# Patient Record
Sex: Male | Born: 1940 | Race: White | Hispanic: No | Marital: Single | Smoking: Former smoker
Health system: Southern US, Community
[De-identification: ages and names within clinical notes are randomized; demographics above are authoritative.]

## PROBLEM LIST (undated history)

## (undated) DIAGNOSIS — I10 Essential (primary) hypertension: Secondary | ICD-10-CM

## (undated) DIAGNOSIS — E7800 Pure hypercholesterolemia, unspecified: Secondary | ICD-10-CM

## (undated) DIAGNOSIS — G629 Polyneuropathy, unspecified: Secondary | ICD-10-CM

## (undated) DIAGNOSIS — Z85038 Personal history of other malignant neoplasm of large intestine: Secondary | ICD-10-CM

## (undated) DIAGNOSIS — C3411 Malignant neoplasm of upper lobe, right bronchus or lung: Secondary | ICD-10-CM

## (undated) HISTORY — DX: Pure hypercholesterolemia, unspecified: E78.00

## (undated) HISTORY — DX: Malignant neoplasm of upper lobe, right bronchus or lung: C34.11

## (undated) HISTORY — DX: Polyneuropathy, unspecified: G62.9

## (undated) HISTORY — DX: Personal history of other malignant neoplasm of large intestine: Z85.038

---

## 2007-02-09 HISTORY — PX: COLECTOMY: SHX59

## 2007-09-09 DIAGNOSIS — Z85038 Personal history of other malignant neoplasm of large intestine: Secondary | ICD-10-CM

## 2007-09-09 HISTORY — DX: Personal history of other malignant neoplasm of large intestine: Z85.038

## 2014-06-28 ENCOUNTER — Ambulatory Visit (HOSPITAL_COMMUNITY)
Admission: RE | Admit: 2014-06-28 | Discharge: 2014-06-28 | Disposition: A | Discharge status: Home / Self Care | Payer: Medicare Other | Source: Ambulatory Visit | Attending: Neurology | Admitting: Neurology

## 2014-06-28 ENCOUNTER — Other Ambulatory Visit: Payer: Self-pay | Admitting: Neurology

## 2014-06-28 DIAGNOSIS — J439 Emphysema, unspecified: Secondary | ICD-10-CM | POA: Diagnosis not present

## 2014-06-28 DIAGNOSIS — R200 Anesthesia of skin: Secondary | ICD-10-CM

## 2014-06-28 DIAGNOSIS — R911 Solitary pulmonary nodule: Secondary | ICD-10-CM | POA: Diagnosis not present

## 2014-07-10 ENCOUNTER — Other Ambulatory Visit: Payer: Self-pay | Admitting: Neurology

## 2014-07-10 ENCOUNTER — Ambulatory Visit (HOSPITAL_COMMUNITY)
Admission: RE | Admit: 2014-07-10 | Discharge: 2014-07-10 | Disposition: A | Discharge status: Home / Self Care | Payer: Medicare Other | Source: Ambulatory Visit | Attending: Neurology | Admitting: Neurology

## 2014-07-10 ENCOUNTER — Encounter (HOSPITAL_COMMUNITY): Payer: Self-pay

## 2014-07-10 DIAGNOSIS — R918 Other nonspecific abnormal finding of lung field: Secondary | ICD-10-CM | POA: Diagnosis present

## 2014-07-10 HISTORY — DX: Essential (primary) hypertension: I10

## 2014-07-10 MED ORDER — IOHEXOL 300 MG/ML  SOLN
100.0000 mL | Freq: Once | INTRAMUSCULAR | Status: AC | PRN
  Administered 2014-07-10: 80 mL via INTRAVENOUS

## 2014-07-18 ENCOUNTER — Encounter (HOSPITAL_COMMUNITY): Payer: Medicare Other | Attending: Oncology | Admitting: Oncology

## 2014-07-18 ENCOUNTER — Encounter (HOSPITAL_COMMUNITY): Payer: Self-pay | Admitting: Oncology

## 2014-07-18 VITALS — BP 163/81 | HR 104 | Temp 97.9°F | Resp 18 | Ht 70.25 in | Wt 171.0 lb

## 2014-07-18 DIAGNOSIS — C3411 Malignant neoplasm of upper lobe, right bronchus or lung: Secondary | ICD-10-CM | POA: Diagnosis not present

## 2014-07-18 DIAGNOSIS — Z87891 Personal history of nicotine dependence: Secondary | ICD-10-CM | POA: Insufficient documentation

## 2014-07-18 HISTORY — DX: Malignant neoplasm of upper lobe, right bronchus or lung: C34.11

## 2014-07-18 NOTE — Progress Notes (Addendum)
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    DIAGNOSIS:  Same as above.  HISTORY OF PRESENT ILLNESS:   Mr. Antenucci is a 74 year old white American man with a past medical history significant for HTN and hyperlipidemia who is referred to [facility] for a newly discover right pancoast tumor measuring 6.7 cm in largest dimension infiltrating surrounding soft tissue and bone with pathologic fractures of T2 and T3 vertebrae and posterior ribs during work-up for upper extremity via work-up by Dr. Merlene Laughter for right upper extremity neuralgia.  Chart reviewed.  Other than imaging performed at [facility], Dr. Freddie Apley notes are used for data collection.  I personally reviewed and went over radiographic studies with the patient.  The results are noted within this dictation.    He reports that beginning in Jan 2016 he started to notice a right arm/hand neuralgia.  He started researching local neurologists in [place], [place] area, but found that it would take him a long time to get an appointment with one locally.  He expanded his search and found Dr. Merlene Laughter who was able to see the patient sooner than other neurologists.  Therefore, he saw Dr. Merlene Laughter who started evaluation for right upper extremity neuralgia.  Work-up included a chest xray that was worrisome for malignancy finding in right upper lung.  This was subsequently followed by CT of chest performed last week.  Referral was sent to oncology and we received the referral form yesterday, 6/8.  He was worked in today for new patient evaluation.  The  patient reports that his appetite is strong, "too good."  He notes that his weight is stable.  He denies any chest pain, hemoptysis, heart palpitations, SOB, dyspnea.  He denies any abdominal pain, blood in stool, black tarry stool, urinary complaints, double vision, headaches.  He is able to button his buttons on his shirt and pants.  He is wearing a button down shirt today.  He denies dropping items in the grasp of his right hand.  He notes that he is using his left hand more often.  He notes an episode of right hand tremors after extensive use of right hand.     PAST MEDICAL HISTORY:   Past Medical History  Diagnosis Date  . Hypertension   . Hypercholesteremia   . History of colon cancer 09/2007  . Peripheral neuropathy     bilateral arms, right worse than left    ALLERGIES: No Known Allergies    MEDICATIONS: I have reviewed the patient's current medications.    No current outpatient prescriptions on file prior to visit.   No current facility-administered medications on file prior to visit.     PAST SURGICAL HISTORY Past Surgical History  Procedure Laterality Date  . Colectomy  2009    FAMILY HISTORY: Family History  Problem Relation Age of Onset  . Cancer Mother   . Cancer Maternal Grandmother   . Diabetes Paternal Grandfather     SOCIAL HISTORY: He admits to a 60 pack year smoking history; smoking 1 ppd since he was 74 years  old, quitting about 6 weeks ago.  "I just decided to quit."  "I have quite a thousand times before."  He currently is chewing nicorette gum.    He admits to social EtOH.  He reports that on Friday- Sunday he has 1-2 beers.  Since his right arm numbness he has noted that having 1-2 nightcaps at HS helps him sleep better so he has been doing that as well.    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  He then worked for the [employer], [employer] x 17 years and he retired in the late 1990's.    He did graduate with a [place] in  business from [employer].   PERFORMANCE STATUS: The patient's performance status is 1 - Symptomatic but completely ambulatory  PHYSICAL EXAM: Most Recent Vital Signs: Blood pressure 163/81, pulse 104, temperature 97.9 F (36.6 C), temperature source Oral, resp. rate 18, height 5' 10.25" (1.784 m), weight 171 lb (77.565 kg), SpO2 99 %. General appearance: alert, cooperative, appears stated age, no distress and well educated Head: Normocephalic, without obvious abnormality, atraumatic Eyes: conjunctivae/corneas clear. PERRL, EOM's intact. Fundi benign. Ears: left external auditory canal cerumen impaction noted.  Right ear is clear with minimal cerumen and pearly white TM Throat: lips, mucosa, and tongue normal; teeth and gums normal Neck: no adenopathy and supple, symmetrical, trachea midline Lungs: clear to auscultation bilaterally and normal percussion bilaterally Heart: regular rate and rhythm, S1, S2 normal, no murmur, click, rub or gallop Abdomen: soft, non-tender; bowel sounds normal; no masses,  no organomegaly Extremities: extremities normal, atraumatic, no cyanosis or edema Skin: Skin color, texture, turgor normal. No rashes or lesions Lymph nodes: Cervical, supraclavicular, and axillary nodes normal. Neurologic: Alert and oriented X 3, normal strength and tone. Normal symmetric reflexes. Normal coordination and gait  LABORATORY DATA:  No results found for this or any previous visit (from the past 48 hour(s)).    RADIOGRAPHY:  07/10/2014  CLINICAL DATA: 74 year old male with asymmetric right apical pulmonary opacity on plain radiographs done for upper extremity numbness. Subsequent encounter.  EXAM: CT CHEST WITH CONTRAST  TECHNIQUE: Multidetector CT imaging of the chest was performed during intravenous contrast administration.  CONTRAST: 97m OMNIPAQUE IOHEXOL 300 MG/ML SOLN  COMPARISON: Chest radiographs 06/28/2014.  FINDINGS: Poorly marginated and  spiculated soft tissue mass occupying the right lung apex with destruction of the right right second and third ribs as well as the second and third thoracic vertebrae. Obliteration of the right T2 neural foramen with tumor extension into the spinal canal, right lateral epidural space on series 2, image 9. Mild pathologic fractures of both the T2 and T3 vertebrae. Cephalad extension of tumor above the right lung apex best seen on coronal image 48. All told, tumor encompasses 54 x 60 x 67 mm (AP by transverse by CC)  Negative thyroid. Small but conspicuous lymph node in the right tracheoesophageal groove just anterior to the tumor on series 2, image 11. No axillary lymphadenopathy. Small but conspicuous prevascular lymph node just anterior and to the right of the trachea on series 2, image 16. Other mediastinal and hilar nodes appear within normal limits.  Medial right upper lobe tumor involvement. Major airways remain patent. Mild curvilinear scarring or atelectasis in both lower lobes. No pleural effusion. No pulmonary nodule/metastasis identified.  No pericardial effusion. Major mediastinal vascular structures appear within normal limits; there is calcified coronary artery atherosclerosis. The right apical tumor is in proximity to the bifurcation of the  right brachiocephalic artery into the right CCA and subclavian (series 2, image 8).  There is heterogeneous enlargement of the right adrenal gland with spiculated margins, encompassing 50 x 27 x 37 mm (AP by transverse by CC). The left adrenal gland is within normal limits. No superior abdominal lymphadenopathy identified. No visible liver metastasis identified.  Cholelithiasis. Visible spleen, pancreas, kidneys, and bowel in the upper abdomen are within normal limits.  Outside of the T2 and T3 level bone involvement, no distant osseous metastasis is identified.  IMPRESSION: 1. Pancoast tumor on the right encompassing  54 x 60 x 67 mm. Infiltration of the surrounding soft tissues and bone with pathologic fractures of the right T2 and T3 vertebrae and posterior ribs. Obliteration of the right T2 neural foramen and epidural tumor placing the patient at risk for future spinal cord compression. 2. Right adrenal metastasis. No other distant metastasis identified.  Electronically Signed: By: Genevie Ann M.D. On: 07/11/2014 08:38    PATHOLOGY:  None  ASSESSMENT:  1. 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  2. Right adrenal metastasis 3. Tobacco abuse, quitting about 6 weeks ago.  60+ pack year smoking history 4. EtOH use, socially 5. HTN 6. Hyperlipidemia   PLAN:  1. I personally reviewed and went over radiographic studies with the patient.  The results are noted within this dictation.   2. Chart reviewed 3. Labs next week: CBC diff, CMET 4. PET scan ASAP 5. IR biopsy of pancoast tumor for treatment planning. 6. Referral to [facility] in [place], [place].  We will call and contact Rad Oncologist there.  I spoke with Dr. Meda Coffee and he has agreed to see the patient in the very near future.  The patient is not a surgical candidate given his epidural tumor.  We would request radiation to lung tumor and possibly to adrenal metastasis. 7. Return in 1.5 weeks for follow-up.  All questions were answered. The patient knows to call the clinic with any problems, questions or concerns. We can certainly see the patient much sooner if necessary.  Patient and plan discussed with Dr. Ancil Linsey and she is in agreement with the aforementioned.   Robynn Pane, PA-C 07/18/2014 9:35 AM   Patient seen and examined. As above.  His disease is extensive in the chest and given invasion seen on imaging into thoracic vertebrae not a surgical candidate.  He has an excellent PS and would do well with  concurrent therapy which would offer reasonable disease control.  I would recommend XRT given his disease and symptomatology.  Would defer to XRT whether or not to radiate the solitary adrenal lesion.  Patient wishes to be treated in [place], he is single and has few social resources.  Will arrange for XRT in [place].    He agrees to PET and biopsy after discussion today.  Will arrange and regroup afterwards. Molli Hazard, MD

## 2014-07-18 NOTE — Patient Instructions (Signed)
[place] at [facility] Discharge Instructions  RECOMMENDATIONS MADE BY THE CONSULTANT AND ANY TEST RESULTS WILL BE SENT TO YOUR REFERRING PHYSICIAN.  Exam and discussion by Robynn Pane, PA-C and Dr. Whitney Muse Will make referral to radiation therapy in [place] Refer to [facility] for biopsy PET Scan - to be done at [facility].  Nothing to eat or drink 6 hours prior to the scan.  No sugar of any kind  Follow-up her in 1 - 2 weeks.  Thank you for choosing [place] at [facility] to provide your oncology and hematology care.  To afford each patient quality time with our provider, please arrive at least 15 minutes before your scheduled appointment time.    You need to re-schedule your appointment should you arrive 10 or more minutes late.  We strive to give you quality time with our providers, and arriving late affects you and other patients whose appointments are after yours.  Also, if you no show three or more times for appointments you may be dismissed from the clinic at the providers discretion.     Again, thank you for choosing [facility].  Our hope is that these requests will decrease the amount of time that you wait before being seen by our physicians.       _____________________________________________________________  Should you have questions after your visit to [facility], please contact our office at [phone] between the hours of 8:30 a.m. and 4:30 p.m.  Voicemails left after 4:30 p.m. will not be returned until the following business day.  For prescription refill requests, have your pharmacy contact our office.

## 2014-07-23 ENCOUNTER — Other Ambulatory Visit: Payer: Self-pay | Admitting: Radiology

## 2014-07-24 ENCOUNTER — Ambulatory Visit (HOSPITAL_COMMUNITY)
Admission: RE | Admit: 2014-07-24 | Discharge: 2014-07-24 | Disposition: A | Discharge status: Home / Self Care | Payer: Medicare Other | Source: Ambulatory Visit | Attending: Oncology | Admitting: Oncology

## 2014-07-24 DIAGNOSIS — C3411 Malignant neoplasm of upper lobe, right bronchus or lung: Secondary | ICD-10-CM | POA: Diagnosis not present

## 2014-07-24 DIAGNOSIS — Z87891 Personal history of nicotine dependence: Secondary | ICD-10-CM | POA: Insufficient documentation

## 2014-07-24 LAB — CBC
HCT: 39.1 % (ref 39.0–52.0)
HEMOGLOBIN: 13.7 g/dL (ref 13.0–17.0)
MCH: 33.3 pg (ref 26.0–34.0)
MCHC: 35 g/dL (ref 30.0–36.0)
MCV: 95.1 fL (ref 78.0–100.0)
Platelets: 324 10*3/uL (ref 150–400)
RBC: 4.11 MIL/uL — AB (ref 4.22–5.81)
RDW: 13.4 % (ref 11.5–15.5)
WBC: 9.6 10*3/uL (ref 4.0–10.5)

## 2014-07-24 LAB — PROTIME-INR
INR: 1.07 (ref 0.00–1.49)
PROTHROMBIN TIME: 14.1 s (ref 11.6–15.2)

## 2014-07-24 LAB — APTT: aPTT: 33 seconds (ref 24–37)

## 2014-07-24 MED ORDER — SODIUM CHLORIDE 0.9 % IV SOLN: Freq: Once | INTRAVENOUS | Status: DC

## 2014-07-24 NOTE — Progress Notes (Signed)
Pt stated that at 830 this AM he had 2 cups of coffee and and sausage biscuit.  He said no one informed him that he was not to eat.  Jannifer Franklin PA has been notified .  Will await further instructions

## 2014-07-24 NOTE — Progress Notes (Signed)
Patient ID: Justin Becker, male   DOB: [birth year], 74 y.o.   MRN: [id number]   Pt was scheduled for Rt pancoast tumor bx Pt ate breakfast at 8am today  Offered to perform procedure later this afternoon Pt chose to reschedule to different day secondary ride arrangements.  Scheduler aware to reschedule and call pt with earliest appt

## 2014-07-25 ENCOUNTER — Encounter (HOSPITAL_COMMUNITY)
Admission: RE | Admit: 2014-07-25 | Discharge: 2014-07-25 | Disposition: A | Discharge status: Home / Self Care | Payer: Medicare Other | Source: Ambulatory Visit | Attending: Oncology | Admitting: Oncology

## 2014-07-25 DIAGNOSIS — C3411 Malignant neoplasm of upper lobe, right bronchus or lung: Secondary | ICD-10-CM

## 2014-07-25 LAB — GLUCOSE, CAPILLARY: Glucose-Capillary: 90 mg/dL (ref 65–99)

## 2014-07-25 MED ORDER — FLUDEOXYGLUCOSE F - 18 (FDG) INJECTION
8.5200 | Freq: Once | INTRAVENOUS | Status: AC | PRN
  Administered 2014-07-25: 8.52 via INTRAVENOUS

## 2014-07-26 ENCOUNTER — Other Ambulatory Visit: Payer: Self-pay | Admitting: Radiology

## 2014-07-29 ENCOUNTER — Ambulatory Visit (HOSPITAL_COMMUNITY)
Admission: RE | Admit: 2014-07-29 | Discharge: 2014-07-29 | Disposition: A | Discharge status: Home / Self Care | Payer: Medicare Other | Source: Ambulatory Visit | Attending: Oncology | Admitting: Oncology

## 2014-07-29 ENCOUNTER — Ambulatory Visit (HOSPITAL_COMMUNITY)
Admission: RE | Admit: 2014-07-29 | Discharge: 2014-07-29 | Disposition: A | Discharge status: Home / Self Care | Payer: Medicare Other | Source: Ambulatory Visit | Attending: Interventional Radiology | Admitting: Interventional Radiology

## 2014-07-29 ENCOUNTER — Encounter (HOSPITAL_COMMUNITY): Payer: Self-pay

## 2014-07-29 DIAGNOSIS — G629 Polyneuropathy, unspecified: Secondary | ICD-10-CM | POA: Insufficient documentation

## 2014-07-29 DIAGNOSIS — Z85828 Personal history of other malignant neoplasm of skin: Secondary | ICD-10-CM | POA: Insufficient documentation

## 2014-07-29 DIAGNOSIS — E780 Pure hypercholesterolemia: Secondary | ICD-10-CM | POA: Insufficient documentation

## 2014-07-29 DIAGNOSIS — Z87891 Personal history of nicotine dependence: Secondary | ICD-10-CM | POA: Diagnosis not present

## 2014-07-29 DIAGNOSIS — I10 Essential (primary) hypertension: Secondary | ICD-10-CM | POA: Diagnosis not present

## 2014-07-29 DIAGNOSIS — Z85038 Personal history of other malignant neoplasm of large intestine: Secondary | ICD-10-CM | POA: Diagnosis not present

## 2014-07-29 DIAGNOSIS — C3411 Malignant neoplasm of upper lobe, right bronchus or lung: Secondary | ICD-10-CM | POA: Insufficient documentation

## 2014-07-29 DIAGNOSIS — Z79899 Other long term (current) drug therapy: Secondary | ICD-10-CM | POA: Diagnosis not present

## 2014-07-29 DIAGNOSIS — R918 Other nonspecific abnormal finding of lung field: Secondary | ICD-10-CM | POA: Diagnosis present

## 2014-07-29 DIAGNOSIS — Z7982 Long term (current) use of aspirin: Secondary | ICD-10-CM | POA: Diagnosis not present

## 2014-07-29 DIAGNOSIS — J95811 Postprocedural pneumothorax: Secondary | ICD-10-CM

## 2014-07-29 HISTORY — PX: OTHER SURGICAL HISTORY: SHX169

## 2014-07-29 MED ORDER — FENTANYL CITRATE (PF) 100 MCG/2ML IJ SOLN
INTRAMUSCULAR | Status: AC | PRN
  Administered 2014-07-29 (×2): 50 ug via INTRAVENOUS

## 2014-07-29 MED ORDER — LIDOCAINE HCL 1 % IJ SOLN
INTRAMUSCULAR | Status: AC
  Filled 2014-07-29: qty 20

## 2014-07-29 MED ORDER — SODIUM CHLORIDE 0.9 % IV SOLN
INTRAVENOUS | Status: DC
  Administered 2014-07-29: 10:00:00 via INTRAVENOUS

## 2014-07-29 MED ORDER — MIDAZOLAM HCL 2 MG/2ML IJ SOLN
INTRAMUSCULAR | Status: AC | PRN
  Administered 2014-07-29: 0.5 mg via INTRAVENOUS
  Administered 2014-07-29: 1 mg via INTRAVENOUS

## 2014-07-29 MED ORDER — MIDAZOLAM HCL 2 MG/2ML IJ SOLN
INTRAMUSCULAR | Status: AC
  Filled 2014-07-29: qty 4

## 2014-07-29 MED ORDER — FENTANYL CITRATE (PF) 100 MCG/2ML IJ SOLN
INTRAMUSCULAR | Status: AC
  Filled 2014-07-29: qty 2

## 2014-07-29 NOTE — Progress Notes (Signed)
Pt declines food and drink post CXR results. Will go out to eat post dc.

## 2014-07-29 NOTE — Procedures (Signed)
Interventional Radiology Procedure Note  Procedure: CT guided biopsy of right lung apical mass.   Complications: None Recommendations: - Bedrest until CXR cleared.  Minimize talking, coughing or otherwise straining.  - Follow up 2 hr CXR pending   Signed,  Criselda Peaches, MD

## 2014-07-29 NOTE — Sedation Documentation (Addendum)
Bandaid R upper back

## 2014-07-29 NOTE — Discharge Instructions (Signed)
Needle Biopsy of Lung, Care After °Refer to this sheet in the next few weeks. These instructions provide you with information on caring for yourself after your procedure. Your health care provider may also give you more specific instructions. Your treatment has been planned according to current medical practices, but problems sometimes occur. Call your health care provider if you have any problems or questions after your procedure. °WHAT TO EXPECT AFTER THE PROCEDURE °· A bandage will be applied over the area where the needle was inserted. You may be asked to apply pressure to the bandage for several minutes to ensure there is minimal bleeding. °· In most cases, you can leave when your needle biopsy procedure is completed. Do not drive yourself home. Someone else should take you home. °· If you received an IV sedative or general anesthetic, you will be taken to a comfortable place to relax while the medicine wears off. °· If you have upcoming travel scheduled, talk to your health care provider about when it is safe to travel by air after the procedure. °HOME CARE INSTRUCTIONS °· Expect to take it easy for the rest of the day. °· Protect the area where you received the needle biopsy by keeping the bandage in place for as long as instructed. °· You may feel some mild pain or discomfort in the area, but this should stop in a day or two. °· Take medicines only as directed by your health care provider. °SEEK MEDICAL CARE IF:  °· You have pain at the biopsy site that worsens or is not helped by medicine. °· You have swelling or drainage at the needle biopsy site. °· You have a fever. °SEEK IMMEDIATE MEDICAL CARE IF:  °· You have new or worsening shortness of breath. °· You have chest pain. °· You are coughing up blood. °· You have bleeding that does not stop with pressure or a bandage. °· You develop light-headedness or fainting. °Document Released: 11/22/2006 Document Revised: 06/11/2013 Document Reviewed:  06/19/2012 °Exit[employer]® Patient Information ©2015 [employer]. This information is not intended to replace advice given to you by your health care provider. Make sure you discuss any questions you have with your health care provider. ° °

## 2014-07-29 NOTE — H&P (Signed)
Chief Complaint:  Rt pancoast tumor  Referring Physician(s): Kefalas,Thomas S  History of Present Illness: Justin Becker is a 74 y.o. male   Hx colon ca 2009 Hx skin cancer 25 yrs ago Developed Rt arm pain; chest pain 02/2014 Work up revealed RUL pancoast tumor Now scheduled for bx of same Stopped smoking 6-8 weeks ago  Past Medical History  Diagnosis Date  . Hypertension   . Hypercholesteremia   . History of colon cancer 09/2007  . Peripheral neuropathy     bilateral arms, right worse than left  . 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 Colectomy  2009    Allergies: Review of patient's allergies indicates no known allergies.  Medications: Prior to Admission medications   Medication Sig Start Date End Date Taking? Authorizing Provider  aspirin EC 81 MG tablet Take 81 mg by mouth daily.   Yes Historical Provider, MD  gabapentin (NEURONTIN) 100 MG capsule Take 100 mg by mouth 2 (two) times daily as needed (pain).  06/25/14  Yes Historical Provider, MD  Ibuprofen (ADVIL) 200 MG CAPS Take 200 mg by mouth every 4 (four) hours as needed (pain).    Yes Historical Provider, MD  lisinopril (PRINIVIL,ZESTRIL) 10 MG tablet Take 10 mg by mouth daily.   Yes Historical Provider, MD  Multiple Vitamin (MULTIVITAMIN WITH MINERALS) TABS tablet Take 1 tablet by mouth daily.   Yes Historical Provider, MD  pravastatin (PRAVACHOL) 10 MG tablet Take 10 mg by mouth at bedtime.  05/30/14  Yes Historical Provider, MD  sildenafil (REVATIO) 20 MG tablet Take 100 mg by mouth daily as needed (erectile dysfunction). No more than 5 tablets in 24 hours 07/12/14  Yes Historical Provider, MD  traMADol (ULTRAM) 50 MG tablet Take 50-100 mg by mouth every 6 (six) hours as needed (pain).  06/26/14  Yes Historical Provider, MD     Family History  Problem Relation Age of Onset  . Cancer Mother   . Cancer Maternal Grandmother   . Diabetes Paternal  Grandfather     History   Social History  . Marital Status: Single    Spouse Name: N/A  . Number of Children: N/A  . Years of Education: N/A   Social History Main Topics  . Smoking status: Former Smoker -- 1.00 packs/day for 60 years    Quit date: 06/10/2014  . Smokeless tobacco: Never Used  . Alcohol Use: 8.4 oz/week    10 Shots of liquor, 4 Cans of beer per week     Comment: social drinker  . Drug Use: No  . Sexual Activity: Not on file   Other Topics Concern  . None   Social History Narrative    Review of Systems: A 12 point ROS discussed and pertinent positives are indicated in the HPI above.  All other systems are negative.  Review of Systems  Constitutional: Positive for activity change. Negative for appetite change and fatigue.  HENT: Negative for trouble swallowing.   Respiratory: Negative for cough and shortness of breath.   Musculoskeletal: Positive for back pain.  Psychiatric/Behavioral: Negative for behavioral problems and confusion.    Vital Signs: BP 169/82 mmHg  Pulse 114  Temp(Src) 98 F (36.7 C) (Oral)  Resp 18  Ht '5\' 11"'$  (1.803 m)  Wt 173 lb (78.472 kg)  BMI 24.14 kg/m2  SpO2 99%  Physical Exam  Constitutional: He is oriented to person, place, and time.  Cardiovascular:  Normal rate, regular rhythm and normal heart sounds.   No murmur heard. Pulmonary/Chest: Effort normal and breath sounds normal. He has no wheezes.  Abdominal: Soft. Bowel sounds are normal. There is no tenderness.  Musculoskeletal: Normal range of motion.  Neurological: He is alert and oriented to person, place, and time.  Skin: Skin is warm and dry.  Psychiatric: He has a normal mood and affect. His behavior is normal. Judgment and thought content normal.  Nursing note and vitals reviewed.   Mallampati Score:  MD Evaluation Airway: WNL Heart: WNL Abdomen: WNL Chest/ Lungs: WNL ASA  Classification: 3 Mallampati/Airway Score: One  Imaging: Ct Chest W  Contrast  07/11/2014   ADDENDUM REPORT: 07/11/2014 08:59  ADDENDUM: Study discussed by telephone with Dr. Phillips Odor on 07/11/2014 at 0855 hours.   Electronically Signed   By: Genevie Ann M.D.   On: 07/11/2014 08:59   07/11/2014   CLINICAL DATA:  74 year old male with asymmetric right apical pulmonary opacity on plain radiographs done for upper extremity numbness. Subsequent encounter.  EXAM: CT CHEST WITH CONTRAST  TECHNIQUE: Multidetector CT imaging of the chest was performed during intravenous contrast administration.  CONTRAST:  42m OMNIPAQUE IOHEXOL 300 MG/ML  SOLN  COMPARISON:  Chest radiographs 06/28/2014.  FINDINGS: Poorly marginated and spiculated soft tissue mass occupying the right lung apex with destruction of the right right second and third ribs as well as the second and third thoracic vertebrae. Obliteration of the right T2 neural foramen with tumor extension into the spinal canal, right lateral epidural space on series 2, image 9. Mild pathologic fractures of both the T2 and T3 vertebrae. Cephalad extension of tumor above the right lung apex best seen on coronal image 48. All told, tumor encompasses 54 x 60 x 67 mm (AP by transverse by CC)  Negative thyroid. Small but conspicuous lymph node in the right tracheoesophageal groove just anterior to the tumor on series 2, image 11. No axillary lymphadenopathy. Small but conspicuous prevascular lymph node just anterior and to the right of the trachea on series 2, image 16. Other mediastinal and hilar nodes appear within normal limits.  Medial right upper lobe tumor involvement. Major airways remain patent. Mild curvilinear scarring or atelectasis in both lower lobes. No pleural effusion. No pulmonary nodule/metastasis identified.  No pericardial effusion. Major mediastinal vascular structures appear within normal limits; there is calcified coronary artery atherosclerosis. The right apical tumor is in proximity to the bifurcation of the right brachiocephalic  artery into the right CCA and subclavian (series 2, image 8).  There is heterogeneous enlargement of the right adrenal gland with spiculated margins, encompassing 50 x 27 x 37 mm (AP by transverse by CC). The left adrenal gland is within normal limits. No superior abdominal lymphadenopathy identified. No visible liver metastasis identified.  Cholelithiasis. Visible spleen, pancreas, kidneys, and bowel in the upper abdomen are within normal limits.  Outside of the T2 and T3 level bone involvement, no distant osseous metastasis is identified.  IMPRESSION: 1. Pancoast tumor on the right encompassing 54 x 60 x 67 mm. Infiltration of the surrounding soft tissues and bone with pathologic fractures of the right T2 and T3 vertebrae and posterior ribs. Obliteration of the right T2 neural foramen and epidural tumor placing the patient at risk for future spinal cord compression. 2. Right adrenal metastasis. No other distant metastasis identified.  Electronically Signed: By: HGenevie AnnM.D. On: 07/11/2014 08:38   Nm Pet Image Initial (pi) Skull Base To Thigh  07/25/2014   CLINICAL DATA:  Initial treatment strategy for right apical Pancoast tumor.  EXAM: NUCLEAR MEDICINE PET SKULL BASE TO THIGH  TECHNIQUE: 8.52 mCi F-18 FDG was injected intravenously. Full-ring PET imaging was performed from the skull base to thigh after the radiotracer. CT data was obtained and used for attenuation correction and anatomic localization.  FASTING BLOOD GLUCOSE:  Value: 90 mg/dl  COMPARISON:  Chest CT 07/10/2014.  FINDINGS: NECK  No hypermetabolic cervical lymph nodes are identified.There are no lesions of the pharyngeal mucosal space. There is physiologic activity associated with the muscles of phonation.  CHEST  The large right apical mass is significantly hypermetabolic with an SUV max of 14.0. This mass measures approximately 5.4 x 4.3 cm transverse, destroying the posterior aspect of the right second and third ribs and the accompanying  vertebral bodies. Intraspinal extension is grossly stable, better seen on prior enhanced study. The pathologic fractures at T2 and T3 are stable. There are no hypermetabolic mediastinal, hilar or axillary lymph nodes. Apart from the right apical mass, there are no other suspicious pulmonary nodules. There is linear atelectasis or scarring in both lung bases. Atherosclerosis of the aorta, great vessels and coronary arteries noted.  ABDOMEN/PELVIS  The right adrenal nodule is hypermetabolic. The right adrenal gland measures 4.4 x 2.3 cm on image 116 and has an SUV max of 9.4. The left adrenal gland appears normal. There is no abnormal metabolic activity within the liver, spleen or pancreas. Multiple calcified gallstones are noted. There is mild aortoiliac atherosclerosis. There is no hypermetabolic nodal activity.  SKELETON  The right apical mass causes contiguous destruction of the T2 and T3 vertebral bodies with pathologic fractures. There is also destruction of the posterior right second and third ribs. No distant osseous metastases identified.  IMPRESSION: 1. The large right apical Pancoast tumor is hypermetabolic. This lesion invades the chest wall, destroying the right second and third ribs posteriorly and the T2 and T3 vertebral bodies. Intraspinal extension of tumor is better seen on prior contrast-enhanced CT, but appears grossly stable. 2. Hypermetabolic right adrenal nodule consistent with metastatic disease. 3. No other evidence of hematogenous metastases. 4. Cholelithiasis.   Electronically Signed   By: Richardean Sale M.D.   On: 07/25/2014 16:08    Labs:  CBC:  Recent Labs  07/24/14 0953  WBC 9.6  HGB 13.7  HCT 39.1  PLT 324    COAGS:  Recent Labs  07/24/14 0953  INR 1.07  APTT 33    BMP: No results for input(s): NA, K, CL, CO2, GLUCOSE, BUN, CALCIUM, CREATININE, GFRNONAA, GFRAA in the last 8760 hours.  Invalid input(s): CMP  LIVER FUNCTION TESTS: No results for input(s):  BILITOT, AST, ALT, ALKPHOS, PROT, ALBUMIN in the last 8760 hours.  TUMOR MARKERS: No results for input(s): AFPTM, CEA, CA199, CHROMGRNA in the last 8760 hours.  Assessment and Plan:  RUL pancoast tumor Hx colon ca Scheduled for bx RUL mass Risks and Benefits discussed with the patient including, but not limited to bleeding, hemoptysis, respiratory failure requiring intubation, infection, pneumothorax requiring chest tube placement, stroke from air embolism or even death. All of the patient's questions were answered, patient is agreeable to proceed. Consent signed and in chart.    Thank you for this interesting consult.  I greatly enjoyed meeting Vincent Streater and look forward to participating in their care.  Signed: Angeline Trick A 07/29/2014, 10:54 AM   I spent a total of  20 Minutes   in face to  face in clinical consultation, greater than 50% of which was counseling/coordinating care for RUL mass bx

## 2014-07-30 ENCOUNTER — Encounter (HOSPITAL_BASED_OUTPATIENT_CLINIC_OR_DEPARTMENT_OTHER): Payer: Medicare Other | Admitting: Hematology & Oncology

## 2014-07-30 ENCOUNTER — Encounter (HOSPITAL_COMMUNITY): Payer: Self-pay | Admitting: Hematology & Oncology

## 2014-07-30 VITALS — BP 136/74 | HR 116 | Temp 98.5°F | Resp 16 | Wt 169.4 lb

## 2014-07-30 DIAGNOSIS — C3411 Malignant neoplasm of upper lobe, right bronchus or lung: Secondary | ICD-10-CM

## 2014-07-30 DIAGNOSIS — C7951 Secondary malignant neoplasm of bone: Secondary | ICD-10-CM

## 2014-07-30 DIAGNOSIS — C7970 Secondary malignant neoplasm of unspecified adrenal gland: Secondary | ICD-10-CM

## 2014-07-30 DIAGNOSIS — C7971 Secondary malignant neoplasm of right adrenal gland: Secondary | ICD-10-CM | POA: Diagnosis not present

## 2014-07-30 NOTE — Progress Notes (Signed)
[facility] Hematology/Oncology Consultation   Name: Justin Becker      MRN: [id number]    Location: Room/bed info not found  Date: 08/14/2014 Time:6:26 PM   REFERRING PHYSICIAN:  Phillips Odor, MD  REASON FOR CONSULT: Right Pancoast tumor measuring 6.7 cm in largest dimension infiltrating surrounding soft tissue and bone with pathologic fractures of T2 and T3 vertebrae and posterior ribs with a right adrenal metastasis.      Pancoast tumor of right lung   07/10/2014 Imaging CT chest- Pancoast tumor on the right encompassing 54 x 60 x 67 mm. Infiltration of the surrounding soft tissues and bone with pathologic fractures of the right T2 and T3 vertebrae and posterior ribs. Obliteration of the right T2 neural foramen and epi    DIAGNOSIS:  Same as above.  HISTORY OF PRESENT ILLNESS:   Justin Becker is a 74 year old  with a past medical history significant for HTN and hyperlipidemia who is referred to [facility] for a newly discover right pancoast tumor measuring 6.7 cm in largest dimension infiltrating surrounding soft tissue and bone with pathologic fractures of T2 and T3 vertebrae and posterior ribs during work-up for upper extremity via work-up by Dr. Merlene Laughter for right upper extremity neuralgia.  He is here alone today.  He came in to see [place] because he wasn't able to make an appointment with the medical oncologist in [place] until sometime in July.  He is here to review the results of his PET scan. He had a biopsy of his lung performed yesterday. He is scheduled to follow up with radiation oncologist in [place] Dr. Meda Coffee once he has a copy of his PET. He wishes to be referred to a medical oncologist in [place] because he lives alone, has limited family and resources. He says that he would love to come here for chemotherapy but it would be very difficult for him to travel.   He still notes significant right hand and right shoulder pain he has some right-handed weakness. He  is anxious to get started on therapy. His performance status is otherwise excellent.  PAST MEDICAL HISTORY:   Past Medical History  Diagnosis Date  . Hypertension   . Hypercholesteremia   . History of colon cancer 09/2007  . Peripheral neuropathy     bilateral arms, right worse than left  . 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    Current Outpatient Prescriptions on File Prior to Visit  Medication Sig Dispense Refill  . gabapentin (NEURONTIN) 100 MG capsule Take 100 mg by mouth 2 (two) times daily as needed (pain).     . Ibuprofen (ADVIL) 200 MG CAPS Take 200 mg by mouth every 4 (four) hours as needed (pain).     Marland Kitchen lisinopril (PRINIVIL,ZESTRIL) 10 MG tablet Take 10 mg by mouth daily.    . Multiple Vitamin (MULTIVITAMIN WITH MINERALS) TABS tablet Take 1 tablet by mouth daily.    . pravastatin (PRAVACHOL) 10 MG tablet Take 10 mg by mouth at bedtime.     . sildenafil (REVATIO) 20 MG tablet Take 100 mg by mouth daily as needed (erectile dysfunction). No more than 5 tablets in 24 hours  5  . traMADol (ULTRAM) 50 MG tablet Take 50-100 mg by mouth every 6 (six) hours as needed (pain).      No current facility-administered medications on file prior  to visit.     PAST SURGICAL HISTORY Past Surgical History  Procedure Laterality Date  . Colectomy  2009  . Ct guided biopsy of lung lesion Right 07/29/14    FAMILY HISTORY: Family History  Problem Relation Age of Onset  . Cancer Mother   . Cancer Maternal Grandmother   . Diabetes Paternal Grandfather   Mother suffered from [place].  SOCIAL HISTORY: He admits to a 60 pack year smoking history; smoking 1 ppd since he was 74 years old, quitting about 6 weeks ago.  "I just decided to quit."  "I have quite a thousand times before."  He currently is chewing nicorette gum.    He admits to social EtOH.  He reports that on Friday- Sunday he  has 1-2 beers.  Since his right arm numbness he has noted that having 1-2 nightcaps at HS helps him sleep better so he has been doing that as well.    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  He then worked for the [employer], [employer] x 17 years and he retired in the late 1990's.    He did graduate with a [place] in business from [employer].   PERFORMANCE STATUS: The patient's performance status is 1 - Symptomatic but completely ambulatory  PHYSICAL EXAM: Most Recent Vital Signs: Blood pressure 136/74, pulse 116, temperature 98.5 F (36.9 C), temperature source Oral, resp. rate 16, weight 169 lb 6.4 oz (76.839 kg), SpO2 97 %. General appearance: alert, cooperative, appears stated age, no distress and well educated Head: Normocephalic, without obvious abnormality, atraumatic Eyes: conjunctivae/corneas clear. PERRL, EOM's intact. Fundi benign. Ears: left external auditory canal cerumen impaction noted.  Right ear is clear with minimal cerumen and pearly white TM Throat: lips, mucosa, and tongue normal; teeth and gums normal Neck: no adenopathy and supple, symmetrical, trachea midline Lungs: clear to auscultation bilaterally and normal percussion bilaterally Heart: regular rate and rhythm, S1, S2 normal, no murmur, click, rub or gallop Abdomen: soft, non-tender; bowel sounds normal; no masses,  no organomegaly Extremities: extremities normal, atraumatic, no cyanosis or edema Skin: Skin color, texture, turgor normal. No rashes or lesions Lymph nodes: Cervical, supraclavicular, and axillary nodes normal. Neurologic: Alert and oriented X 3, normal strength and tone. Normal symmetric reflexes. Normal coordination and gait   RADIOGRAPHY: CLINICAL DATA: Initial treatment strategy for right apical Pancoast tumor.  EXAM: NUCLEAR MEDICINE PET SKULL BASE TO THIGH  TECHNIQUE: 8.52 mCi F-18 FDG was injected intravenously. Full-ring PET imaging was performed from the  skull base to thigh after the radiotracer. CT data was obtained and used for attenuation correction and anatomic localization.  FASTING BLOOD GLUCOSE: Value: 90 mg/dl  COMPARISON: Chest CT 07/10/2014.  FINDINGS: NECK  No hypermetabolic cervical lymph nodes are identified.There are no lesions of the pharyngeal mucosal space. There is physiologic activity associated with the muscles of phonation.  CHEST  The large right apical mass is significantly hypermetabolic with an SUV max of 14.0. This mass measures approximately 5.4 x 4.3 cm transverse, destroying the posterior aspect of the right second and third ribs and the accompanying vertebral bodies. Intraspinal extension is grossly stable, better seen on prior enhanced study. The pathologic fractures at T2 and T3 are stable. There are no hypermetabolic mediastinal, hilar or axillary lymph nodes. Apart from the right apical mass, there are no other suspicious pulmonary nodules. There is linear atelectasis or scarring in both lung bases. Atherosclerosis of the aorta, great vessels and coronary arteries noted.  ABDOMEN/PELVIS  The right adrenal nodule is hypermetabolic. The right adrenal gland measures 4.4 x 2.3 cm on image 116 and has an SUV max of 9.4. The left adrenal gland appears normal. There is no abnormal metabolic activity within the liver, spleen or pancreas. Multiple calcified gallstones are noted. There is mild aortoiliac atherosclerosis. There is no hypermetabolic nodal activity.  SKELETON  The right apical mass causes contiguous destruction of the T2 and T3 vertebral bodies with pathologic fractures. There is also destruction of the posterior right second and third ribs. No distant osseous metastases identified.  IMPRESSION: 1. The large right apical Pancoast tumor is hypermetabolic. This lesion invades the chest wall, destroying the right second and third ribs posteriorly and the T2 and T3  vertebral bodies. Intraspinal extension of tumor is better seen on prior contrast-enhanced CT, but appears grossly stable. 2. Hypermetabolic right adrenal nodule consistent with metastatic disease. 3. No other evidence of hematogenous metastases. 4. Cholelithiasis.   Electronically Signed  By: Richardean Sale M.D.  On: 07/25/2014 16:08   PATHOLOGY:  REPORT OF SURGICAL PATHOLOGY FINAL DIAGNOSIS Diagnosis Lung, needle/core biopsy(ies), right upper lobe - ADENOCARCINOMA. - LYMPHOVASCULAR INVASION IS IDENTIFIED. - SEE COMMENT. Microscopic Comment immunohistochemical stains were performed to help evaluate the phenotype of the tumor. The malignant cells are positive for cytokeratin 7. A mucicarmine stain highlights the presence of intra and extracellular mucin. The tumor cells are negative for cytokeratin 5/6, cytokeratin 20, Napsin A, p63, and TTF-1. Overall, the findings are consistent with adenocarcinoma. There is likely sufficient tissue for additional studies, if requested. Dr. Vicente Males has reviewed the case and concurs with this interpretation. (JBK:ds 07/31/14) Enid Cutter MD Pathologist, Electronic Signature (Case signed 07/31/2014) Specimen Gross and Clinical Information Specimen(s) Obtained: Lung, needle/core biopsy(ies), right upper lobe Specimen Clinical    ASSESSMENT:  1. 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 Tobacco abuse, quitting about 6 weeks ago.  60+ pack year smoking history 4. EtOH use, socially 5. HTN 6. Hyperlipidemia    PLAN:  We will go ahead and make appropriate referral to [place] oncology. The patient is already set up with radiation oncology in [place]. I advised him I did not know what his radiation oncology plan was. But it may not be unreasonable to radiate the adrenal lesion as well. He is an excellent candidate for systemic therapy. I did  discuss staging with him and advised him he is stage IV. The adrenal lesion of note has not been biopsied.  Biggest concern is pain control, quality of life and prolonging his life. I advised him that treatment can certainly do all these things for him.  All questions were answered. The patient knows to call the clinic with any problems, questions or concerns. We can certainly see the patient much sooner if necessary.  This document serves as a record of services personally performed by Ancil Linsey, MD. It was created on her behalf by Arlyce Harman, a trained medical scribe. The creation of this record is based on the scribe's personal observations and the provider's statements to them. This document has been checked and approved by the attending provider.  I have reviewed the above documentation for accuracy and completeness, and I agree with the above.   Molli Hazard, MD 08/14/2014 6:26 PM

## 2014-07-30 NOTE — Patient Instructions (Signed)
[place] at [facility] Discharge Instructions  RECOMMENDATIONS MADE BY THE CONSULTANT AND ANY TEST RESULTS WILL BE SENT TO YOUR REFERRING PHYSICIAN.   Exam and discussion by Dr. Whitney Muse. We would love to treat you here but it will be easier for you to be treated in [place]. We will refer you to a Medical Oncologist in there. Either we or they will call you with an appointment day and time.  Thank you for choosing [place] at [facility] to provide your oncology and hematology care.  To afford each patient quality time with our provider, please arrive at least 15 minutes before your scheduled appointment time.    You need to re-schedule your appointment should you arrive 10 or more minutes late.  We strive to give you quality time with our providers, and arriving late affects you and other patients whose appointments are after yours.  Also, if you no show three or more times for appointments you may be dismissed from the clinic at the providers discretion.     Again, thank you for choosing [facility].  Our hope is that these requests will decrease the amount of time that you wait before being seen by our physicians.       _____________________________________________________________  Should you have questions after your visit to [facility], please contact our office at [phone] between the hours of 8:30 a.m. and 4:30 p.m.  Voicemails left after 4:30 p.m. will not be returned until the following business day.  For prescription refill requests, have your pharmacy contact our office.

## 2014-08-01 ENCOUNTER — Telehealth (HOSPITAL_COMMUNITY): Payer: Self-pay | Admitting: Oncology

## 2014-08-01 ENCOUNTER — Other Ambulatory Visit (HOSPITAL_COMMUNITY): Payer: Self-pay | Admitting: Oncology

## 2014-08-01 NOTE — Telephone Encounter (Signed)
I discussed the patient's case with Dr. Meda Coffee ([place]) in [place], [place].  Dr. Donald Pore 07/30/2014 dictation is not yet completed, but in short, the patient underwent PET imaging on 6/16 demonstrating a large right apical mass measuring 5.4 cm in largest dimension destroying the posterior aspect of the right second and third ribs and the accompanying vertebral bodies.  Stable intraspinal extension grossly identified, but seen better on previous contrasted CT imaging.  No other concerning findings within the chest.  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  Since this is an adenocarcinoma, I have called pathology today (08/01/2014) to request ALK and EGFR testing.   My understanding is that on the recent patient encounter, the patient was concerned about difficulty traveling to [facility] for treatment.  "I would love to come here, but in reality it will be tough."  After a long discussion, the patient requested a transfer of his oncology care to medical oncology in [place], [place] which is much closer to home for him.    I was tasked with updating Dr. Meda Coffee on this information.  In discussion, Dr. Meda Coffee was concerned about getting the patient an appointment locally in [facility] in a timely fashion.  Additionally, he is prepared to treat the patient at the beginning of next week.  He notes that it will be 2-3 weeks before the patient can be seen by medical oncology.  Therefore, he requested we get treatment started here at [facility] and he graciously agreed to help get the patient involved locally with a medical oncologist.  Due to concerns about transportation, I called the patient with this information.  He is agreeable to start treatment with [place] while we wait for his oncology care to be transferred to medical oncology in [place].  Given his metastatic disease, a long-term  remission is the goal of treatment since gross intraspinal invasion precludes him for surgical resection of primary tumor and this, according to Dr. Donald Pore note, was discussed in her last office visit with her. Thus treatment options are as follows: 1. Cisplatin (day 1 and 8) and Etoposide on days 1-5 + concurrent radiation. 2. Carboplatin/Alimta every 21 days + concurrent radiation.  We discussed the pros and cons, risks, benefits, alternatives (including waiting until he is seen by medical oncology in [place]), and side effects of therapy.  The main pros and cons really come down to the patient's ability to travel.  As a result, he has decided to pursue the latter option (Carboplatin/Alimta).  This can be given concomitantly with radiation therapy without dose adjustments.  He is a very fit and intelligent man and he would be a candidate for either chemotherapy regimen.  He made his decision based upon transportation.    We will start Monday.  Carboplatin/Alimta treatment will take about 2 hours to administer, not including time for pre-chemotherapy labs and mixing of the medication by pharmacy.  So in total, I would conservatively estimate a 3-4 hour chemotherapy appointment.  We will start at 9:45 AM.  I will fax this telephone encounter note to Dr. Meda Coffee so he can plan for radiation accordingly.  I will also reach out to Dr. Meda Coffee to make sure he received this note via fax tomorrow (since it is presently late in the evening).  Doy Mince 08/01/2014 6:55 PM

## 2014-08-02 ENCOUNTER — Other Ambulatory Visit (HOSPITAL_COMMUNITY): Payer: Self-pay | Admitting: Oncology

## 2014-08-02 MED ORDER — PROCHLORPERAZINE MALEATE 10 MG PO TABS: 10.0000 mg | ORAL_TABLET | Freq: Four times a day (QID) | ORAL | Status: DC | PRN

## 2014-08-02 MED ORDER — FOLIC ACID 1 MG PO TABS: 1.0000 mg | ORAL_TABLET | Freq: Every day | ORAL | Status: DC

## 2014-08-02 MED ORDER — ONDANSETRON HCL 8 MG PO TABS: ORAL_TABLET | ORAL | Status: DC

## 2014-08-02 MED ORDER — DEXAMETHASONE 4 MG PO TABS: ORAL_TABLET | ORAL | Status: DC

## 2014-08-02 NOTE — Patient Instructions (Signed)
Justin Becker   CHEMOTHERAPY INSTRUCTIONS  Premeds: Zofran - for nausea/vomiting prevention/reduction. Dexamethasone - steroid - given to reduce the risk of you having an allergic type reaction to the chemotherapy. Dex can cause you to feel energized, nervous/anxious/jittery, make you have trouble sleeping, and/or make you feel hot/flushed in the face/neck and/or look pink/red in the face/neck. These side effects will pass as the Dex wears off.   Pemetrexed - bone marrow suppression (lowers white blood cells (fight infection), lowers red blood cells (make up your blood), lowers platelets (help blood to clot). Fatigue, nausea/vomiting, chest pain, shortness of breath. While taking Pemetrexed, we will have you take Folic acid at home. You will need to take Folic acid every day and for 21 days after the completion of Pemetrexed. We will also give you Vitamin B12 injections periodically during your treatments. You will also be taking a steroid (Dexamethasone) while taking your Pemetrexed treatments. You will take this the day before, day of, and day after Pemetrexed. This will decrease the incidence of skin rash. Take it whether you think you need it or not.    (takes 10 minutes to infuse)   Carboplatin - this medication can be hard on your kidneys - this is why we need you to drink     m 64 oz of fluid (preferably water/decaff fluids) 2 days prior to chemo and for up to 4-5 days after chemo. Drink more if you can. This will help to keep your kidneys flushed. This can cause mild hair loss, lower your platelets (which keep you from bleeding out when you cut yourself), lower your white blood cells (fight infection), and cause nausea/vomiting. (only takes 30 minutes to infuse)   POTENTIAL SIDE EFFECTS OF TREATMENT: Increased Susceptibility to Infection, Vomiting, Constipation, Hair Thinning, Changes in Character of Skin and Nails (brittleness, dryness,etc.), Bone Marrow  Suppression, Nausea, Diarrhea, Sun Sensitivity and Mouth Sores    EDUCATIONAL MATERIALS GIVEN AND REVIEWED: Chemotherapy and You book Specific Instructions Sheets: Carboplatin, Pemetrexed, Zofran, Dexamethasone, Compazine, Folic Acid, Vitamin R15   SELF CARE ACTIVITIES WHILE ON CHEMOTHERAPY: Increase your fluid intake 48 hours prior to treatment and drink at least 2 quarts per day after treatment., No alcohol intake., No aspirin or other medications unless approved by your oncologist., Eat foods that are light and easy to digest., Eat foods at cold or room temperature., No fried, fatty, or spicy foods immediately before or after treatment., Have teeth cleaned professionally before starting treatment. Keep dentures and partial plates clean., Use soft toothbrush and do not use mouthwashes that contain alcohol. Biotene is a good mouthwash that is available at most pharmacies or may be ordered by calling [phone]., Use warm salt water gargles (1 teaspoon salt per 1 quart warm water) before and after meals and at bedtime. Or you may rinse with 2 tablespoons of three -percent hydrogen peroxide mixed in eight ounces of water., Always use sunscreen with SPF (Sun Protection Factor) of 30 or higher., Use your nausea medication as directed to prevent nausea., Use your stool softener or laxative as directed to prevent constipation. and Use your anti-diarrheal medication as directed to stop diarrhea.  Please wash your hands for at least 30 seconds using warm soapy water. Handwashing is the #1 way to prevent the spread of germs. Stay away from sick people or people who are getting over a cold. If you develop respiratory systems such as green/yellow mucus production or productive cough or persistent cough let  us know and we will see if you need an antibiotic. It is a good idea to keep a pair of gloves on when going into grocery stores/[employer] to decrease your risk of coming into contact with germs on the carts,  etc. Carry alcohol hand gel with you at all times and use it frequently if out in public. All foods need to be cooked thoroughly. No raw foods. No medium or undercooked meats, eggs. If your food is cooked medium well, it does not need to be hot pink or saturated with bloody liquid at all. Vegetables and fruits need to be washed/rinsed under the faucet with a dish detergent before being consumed. You can eat raw fruits and vegetables unless we tell you otherwise but it would be best if you cooked them or bought frozen. Do not eat off of salad bars or hot bars unless you really trust the cleanliness of the restaurant. If you need dental work, please let Dr. Whitney Muse know before you go for your appointment so that we can coordinate the best possible time for you in regards to your chemo regimen. You need to also let your dentist know that you are actively taking chemo. We may need to do labs prior to your dental appointment. We also want your bowels moving at least every other day. If this is not happening, we need to know so that we can get you on a bowel regimen to help you go.       MEDICATIONS: You have been given prescriptions for the following medications:  Dexamethasone '4mg'$  tablet. The day before, day of, and day after chemo take 2 tabs in the am and 2 tabs in the pm. Take with food.  Zofran '8mg'$  tablet. Take 1 tablet every 8 hours as needed for nausea/vomiting. (#1 nausea med to take, this can constipate)  Compazine '10mg'$  tablet. Take 1 tablet every 6 hours as needed for nausea/vomiting. (#2 nausea med to take, this can make you sleepy)  Folic Acid '1mg'$ . Take 1 tablet daily.   Over-the-Counter Meds:  Miralax 1 capful in 8 oz of fluid daily. May increase to two times a day if needed. This is a stool softener. If this doesn't work proceed you can add:  Senokot S  - start with 1 tablet two times a day and increase to 4 tablets two times a day if needed. (total of 8 tablets in a 24 hour period).  This is a stimulant laxative.   Call us if this does not help your bowels move.   Imodium '2mg'$  capsule. Take 2 capsules after the 1st loose stool and then 1 capsule every 2 hours until you go a total of 12 hours without having a loose stool. Call the [facility] if loose stools continue.  SYMPTOMS TO REPORT AS SOON AS POSSIBLE AFTER TREATMENT:  FEVER GREATER THAN 100.5 F  CHILLS WITH OR WITHOUT FEVER  NAUSEA AND VOMITING THAT IS NOT CONTROLLED WITH YOUR NAUSEA MEDICATION  UNUSUAL SHORTNESS OF BREATH  UNUSUAL BRUISING OR BLEEDING  TENDERNESS IN MOUTH AND THROAT WITH OR WITHOUT PRESENCE OF ULCERS  URINARY PROBLEMS  BOWEL PROBLEMS  UNUSUAL RASH    Wear comfortable clothing and clothing appropriate for easy access to any Portacath or PICC line. Let us know if there is anything that we can do to make your therapy better!      I have been informed and understand all of the instructions given to me and have received a copy. I have  been instructed to call the clinic [phone] or my family physician as soon as possible for continued medical care, if indicated. I do not have any more questions at this time but understand that I may call the [facility] or the Patient Navigator at [phone] during office hours should I have questions or need assistance in obtaining follow-up care.           Carboplatin injection What is this medicine? CARBOPLATIN (KAR boe pla tin) is a chemotherapy drug. It targets fast dividing cells, like cancer cells, and causes these cells to die. This medicine is used to treat ovarian cancer and many other cancers. This medicine may be used for other purposes; ask your health care provider or pharmacist if you have questions. COMMON BRAND NAME(S): Paraplatin What should I tell my health care provider before I take this medicine? They need to know if you have any of these conditions: -blood disorders -hearing problems -kidney  disease -recent or ongoing radiation therapy -an unusual or allergic reaction to carboplatin, cisplatin, other chemotherapy, other medicines, foods, dyes, or preservatives -pregnant or trying to get pregnant -breast-feeding How should I use this medicine? This drug is usually given as an infusion into a vein. It is administered in a hospital or clinic by a specially trained health care professional. Talk to your pediatrician regarding the use of this medicine in children. Special care may be needed. Overdosage: If you think you have taken too much of this medicine contact a poison control center or emergency room at once. NOTE: This medicine is only for you. Do not share this medicine with others. What if I miss a dose? It is important not to miss a dose. Call your doctor or health care professional if you are unable to keep an appointment. What may interact with this medicine? -medicines for seizures -medicines to increase blood counts like filgrastim, pegfilgrastim, sargramostim -some antibiotics like amikacin, gentamicin, neomycin, streptomycin, tobramycin -vaccines Talk to your doctor or health care professional before taking any of these medicines: -acetaminophen -aspirin -ibuprofen -ketoprofen -naproxen This list may not describe all possible interactions. Give your health care provider a list of all the medicines, herbs, non-prescription drugs, or dietary supplements you use. Also tell them if you smoke, drink alcohol, or use illegal drugs. Some items may interact with your medicine. What should I watch for while using this medicine? Your condition will be monitored carefully while you are receiving this medicine. You will need important blood work done while you are taking this medicine. This drug may make you feel generally unwell. This is not uncommon, as chemotherapy can affect healthy cells as well as cancer cells. Report any side effects. Continue your course of treatment even  though you feel ill unless your doctor tells you to stop. In some cases, you may be given additional medicines to help with side effects. Follow all directions for their use. Call your doctor or health care professional for advice if you get a fever, chills or sore throat, or other symptoms of a cold or flu. Do not treat yourself. This drug decreases your body's ability to fight infections. Try to avoid being around people who are sick. This medicine may increase your risk to bruise or bleed. Call your doctor or health care professional if you notice any unusual bleeding. Be careful brushing and flossing your teeth or using a toothpick because you may get an infection or bleed more easily. If you have any dental work done, tell  your dentist you are receiving this medicine. Avoid taking products that contain aspirin, acetaminophen, ibuprofen, naproxen, or ketoprofen unless instructed by your doctor. These medicines may hide a fever. Do not become pregnant while taking this medicine. Women should inform their doctor if they wish to become pregnant or think they might be pregnant. There is a potential for serious side effects to an unborn child. Talk to your health care professional or pharmacist for more information. Do not breast-feed an infant while taking this medicine. What side effects may I notice from receiving this medicine? Side effects that you should report to your doctor or health care professional as soon as possible: -allergic reactions like skin rash, itching or hives, swelling of the face, lips, or tongue -signs of infection - fever or chills, cough, sore throat, pain or difficulty passing urine -signs of decreased platelets or bleeding - bruising, pinpoint red spots on the skin, black, tarry stools, nosebleeds -signs of decreased red blood cells - unusually weak or tired, fainting spells, lightheadedness -breathing problems -changes in hearing -changes in vision -chest pain -high  blood pressure -low blood counts - This drug may decrease the number of white blood cells, red blood cells and platelets. You may be at increased risk for infections and bleeding. -nausea and vomiting -pain, swelling, redness or irritation at the injection site -pain, tingling, numbness in the hands or feet -problems with balance, talking, walking -trouble passing urine or change in the amount of urine Side effects that usually do not require medical attention (report to your doctor or health care professional if they continue or are bothersome): -hair loss -loss of appetite -metallic taste in the mouth or changes in taste This list may not describe all possible side effects. Call your doctor for medical advice about side effects. You may report side effects to FDA at 1-800-FDA-1088. Where should I keep my medicine? This drug is given in a hospital or clinic and will not be stored at home. NOTE: This sheet is a summary. It may not cover all possible information. If you have questions about this medicine, talk to your doctor, pharmacist, or health care provider.  2015, [employer]vier/[employer] Standard. (2007-05-02 14:38:05) Pemetrexed injection What is this medicine? PEMETREXED (PEM e TREX ed) is a chemotherapy drug. This medicine affects cells that are rapidly growing, such as cancer cells and cells in your mouth and stomach. It is usually used to treat lung cancers like non-small cell lung cancer and mesothelioma. It may also be used to treat other cancers. This medicine may be used for other purposes; ask your health care provider or pharmacist if you have questions. COMMON BRAND NAME(S): Alimta What should I tell my health care provider before I take this medicine? They need to know if you have any of these conditions: -if you frequently drink alcohol containing beverages -infection (especially a virus infection such as chickenpox, cold sores, or herpes) -kidney disease -liver disease -low blood  counts, like low platelets, red bloods, or white blood cells -an unusual or allergic reaction to pemetrexed, mannitol, other medicines, foods, dyes, or preservatives -pregnant or trying to get pregnant -breast-feeding How should I use this medicine? This drug is given as an infusion into a vein. It is administered in a hospital or clinic by a specially trained health care professional. Talk to your pediatrician regarding the use of this medicine in children. Special care may be needed. Overdosage: If you think you have taken too much of this medicine contact a poison control  center or emergency room at once. NOTE: This medicine is only for you. Do not share this medicine with others. What if I miss a dose? It is important not to miss your dose. Call your doctor or health care professional if you are unable to keep an appointment. What may interact with this medicine? -aspirin and aspirin-like medicines -medicines to increase blood counts like filgrastim, pegfilgrastim, sargramostim -methotrexate -NSAIDS, medicines for pain and inflammation, like ibuprofen or naproxen -probenecid -pyrimethamine -vaccines Talk to your doctor or health care professional before taking any of these medicines: -acetaminophen -aspirin -ibuprofen -ketoprofen -naproxen This list may not describe all possible interactions. Give your health care provider a list of all the medicines, herbs, non-prescription drugs, or dietary supplements you use. Also tell them if you smoke, drink alcohol, or use illegal drugs. Some items may interact with your medicine. What should I watch for while using this medicine? Visit your doctor for checks on your progress. This drug may make you feel generally unwell. This is not uncommon, as chemotherapy can affect healthy cells as well as cancer cells. Report any side effects. Continue your course of treatment even though you feel ill unless your doctor tells you to stop. In some cases,  you may be given additional medicines to help with side effects. Follow all directions for their use. Call your doctor or health care professional for advice if you get a fever, chills or sore throat, or other symptoms of a cold or flu. Do not treat yourself. This drug decreases your body's ability to fight infections. Try to avoid being around people who are sick. This medicine may increase your risk to bruise or bleed. Call your doctor or health care professional if you notice any unusual bleeding. Be careful brushing and flossing your teeth or using a toothpick because you may get an infection or bleed more easily. If you have any dental work done, tell your dentist you are receiving this medicine. Avoid taking products that contain aspirin, acetaminophen, ibuprofen, naproxen, or ketoprofen unless instructed by your doctor. These medicines may hide a fever. Call your doctor or health care professional if you get diarrhea or mouth sores. Do not treat yourself. To protect your kidneys, drink water or other fluids as directed while you are taking this medicine. Men and women must use effective birth control while taking this medicine. You may also need to continue using effective birth control for a time after stopping this medicine. Do not become pregnant while taking this medicine. Tell your doctor right away if you think that you or your partner might be pregnant. There is a potential for serious side effects to an unborn child. Talk to your health care professional or pharmacist for more information. Do not breast-feed an infant while taking this medicine. This medicine may lower sperm counts. What side effects may I notice from receiving this medicine? Side effects that you should report to your doctor or health care professional as soon as possible: -allergic reactions like skin rash, itching or hives, swelling of the face, lips, or tongue -low blood counts - this medicine may decrease the number of  white blood cells, red blood cells and platelets. You may be at increased risk for infections and bleeding. -signs of infection - fever or chills, cough, sore throat, pain or difficulty passing urine -signs of decreased platelets or bleeding - bruising, pinpoint red spots on the skin, black, tarry stools, blood in the urine -signs of decreased red blood cells - unusually  weak or tired, fainting spells, lightheadedness -breathing problems, like a dry cough -changes in emotions or moods -chest pain -confusion -diarrhea -high blood pressure -mouth or throat sores or ulcers -pain, swelling, warmth in the leg -pain on swallowing -swelling of the ankles, feet, hands -trouble passing urine or change in the amount of urine -vomiting -yellowing of the eyes or skin Side effects that usually do not require medical attention (report to your doctor or health care professional if they continue or are bothersome): -hair loss -loss of appetite -nausea -stomach upset This list may not describe all possible side effects. Call your doctor for medical advice about side effects. You may report side effects to FDA at 1-800-FDA-1088. Where should I keep my medicine? This drug is given in a hospital or clinic and will not be stored at home. NOTE: This sheet is a summary. It may not cover all possible information. If you have questions about this medicine, talk to your doctor, pharmacist, or health care provider.  2015, [employer]vier/Gold Standard. (2007-08-29 13:24:03) Dexamethasone tablets What is this medicine? DEXAMETHASONE (dex a METH a sone) is a corticosteroid. It is commonly used to treat inflammation of the skin, joints, lungs, and other organs. Common conditions treated include asthma, allergies, and arthritis. It is also used for other conditions, such as blood disorders and diseases of the adrenal glands. This medicine may be used for other purposes; ask your health care provider or pharmacist if you  have questions. COMMON BRAND NAME(S): Decadron, DexPak Sterling Big, DexPak TaperPak, Zema-Pak What should I tell my health care provider before I take this medicine? They need to know if you have any of these conditions: -Cushing's syndrome -diabetes -glaucoma -heart problems or disease -high blood pressure -infection like herpes, measles, tuberculosis, or chickenpox -kidney disease -liver disease -mental problems -myasthenia gravis -osteoporosis -previous heart attack -seizures -stomach, ulcer or intestine disease including colitis and diverticulitis -thyroid problem -an unusual or allergic reaction to dexamethasone, corticosteroids, other medicines, lactose, foods, dyes, or preservatives -pregnant or trying to get pregnant -breast-feeding How should I use this medicine? Take this medicine by mouth with a drink of water. Follow the directions on the prescription label. Take it with food or milk to avoid stomach upset. If you are taking this medicine once a day, take it in the morning. Do not take more medicine than you are told to take. Do not suddenly stop taking your medicine because you may develop a severe reaction. Your doctor will tell you how much medicine to take. If your doctor wants you to stop the medicine, the dose may be slowly lowered over time to avoid any side effects. Talk to your pediatrician regarding the use of this medicine in children. Special care may be needed. Patients over 31 years old may have a stronger reaction and need a smaller dose. Overdosage: If you think you have taken too much of this medicine contact a poison control center or emergency room at once. NOTE: This medicine is only for you. Do not share this medicine with others. What if I miss a dose? If you miss a dose, take it as soon as you can. If it is almost time for your next dose, talk to your doctor or health care professional. You may need to miss a dose or take an extra dose. Do not take  double or extra doses without advice. What may interact with this medicine? Do not take this medicine with any of the following medications: -mifepristone, RU-486 -vaccines This  medicine may also interact with the following medications: -amphotericin B -antibiotics like clarithromycin, erythromycin, and troleandomycin -aspirin and aspirin-like drugs -barbiturates like phenobarbital -carbamazepine -cholestyramine -cholinesterase inhibitors like donepezil, galantamine, rivastigmine, and tacrine -cyclosporine -digoxin -diuretics -ephedrine -male hormones, like estrogens or progestins and birth control pills -indinavir -isoniazid -ketoconazole -medicines for diabetes -medicines that improve muscle tone or strength for conditions like myasthenia gravis -NSAIDs, medicines for pain and inflammation, like ibuprofen or naproxen -phenytoin -rifampin -thalidomide -warfarin This list may not describe all possible interactions. Give your health care provider a list of all the medicines, herbs, non-prescription drugs, or dietary supplements you use. Also tell them if you smoke, drink alcohol, or use illegal drugs. Some items may interact with your medicine. What should I watch for while using this medicine? Visit your doctor or health care professional for regular checks on your progress. If you are taking this medicine over a prolonged period, carry an identification card with your name and address, the type and dose of your medicine, and your doctor's name and address. This medicine may increase your risk of getting an infection. Stay away from people who are sick. Tell your doctor or health care professional if you are around anyone with measles or chickenpox. If you are going to have surgery, tell your doctor or health care professional that you have taken this medicine within the last twelve months. Ask your doctor or health care professional about your diet. You may need to lower the  amount of salt you eat. The medicine can increase your blood sugar. If you are a diabetic check with your doctor if you need help adjusting the dose of your diabetic medicine. What side effects may I notice from receiving this medicine? Side effects that you should report to your doctor or health care professional as soon as possible: -allergic reactions like skin rash, itching or hives, swelling of the face, lips, or tongue -changes in vision -fever, sore throat, sneezing, cough, or other signs of infection, wounds that will not heal -increased thirst -mental depression, mood swings, mistaken feelings of self importance or of being mistreated -pain in hips, back, ribs, arms, shoulders, or legs -redness, blistering, peeling or loosening of the skin, including inside the mouth -trouble passing urine or change in the amount of urine -swelling of feet or lower legs -unusual bleeding or bruising Side effects that usually do not require medical attention (report to your doctor or health care professional if they continue or are bothersome): -headache -nausea, vomiting -skin problems, acne, thin and shiny skin -weight gain This list may not describe all possible side effects. Call your doctor for medical advice about side effects. You may report side effects to FDA at 1-800-FDA-1088. Where should I keep my medicine? Keep out of the reach of children. Store at room temperature between 20 and 25 degrees C (68 and 77 degrees F). Protect from light. Throw away any unused medicine after the expiration date. NOTE: This sheet is a summary. It may not cover all possible information. If you have questions about this medicine, talk to your doctor, pharmacist, or health care provider.  2015, Elsevier/Gold Standard. (2007-05-18 14:02:13) Ondansetron tablets What is this medicine? ONDANSETRON (on DAN se tron) is used to treat nausea and vomiting caused by chemotherapy. It is also used to prevent or treat  nausea and vomiting after surgery. This medicine may be used for other purposes; ask your health care provider or pharmacist if you have questions. COMMON BRAND NAME(S): Zofran What should I  tell my health care provider before I take this medicine? They need to know if you have any of these conditions: -heart disease -history of irregular heartbeat -liver disease -low levels of magnesium or potassium in the blood -an unusual or allergic reaction to ondansetron, granisetron, other medicines, foods, dyes, or preservatives -pregnant or trying to get pregnant -breast-feeding How should I use this medicine? Take this medicine by mouth with a glass of water. Follow the directions on your prescription label. Take your doses at regular intervals. Do not take your medicine more often than directed. Talk to your pediatrician regarding the use of this medicine in children. Special care may be needed. Overdosage: If you think you have taken too much of this medicine contact a poison control center or emergency room at once. NOTE: This medicine is only for you. Do not share this medicine with others. What if I miss a dose? If you miss a dose, take it as soon as you can. If it is almost time for your next dose, take only that dose. Do not take double or extra doses. What may interact with this medicine? Do not take this medicine with any of the following medications: -apomorphine -certain medicines for fungal infections like fluconazole, itraconazole, ketoconazole, posaconazole, voriconazole -cisapride -dofetilide -dronedarone -pimozide -thioridazine -ziprasidone This medicine may also interact with the following medications: -carbamazepine -certain medicines for depression, anxiety, or psychotic disturbances -fentanyl -linezolid -MAOIs like Carbex, Eldepryl, Marplan, Nardil, and Parnate -methylene blue (injected into a vein) -other medicines that prolong the QT interval (cause an abnormal  heart rhythm) -phenytoin -rifampicin -tramadol This list may not describe all possible interactions. Give your health care provider a list of all the medicines, herbs, non-prescription drugs, or dietary supplements you use. Also tell them if you smoke, drink alcohol, or use illegal drugs. Some items may interact with your medicine. What should I watch for while using this medicine? Check with your doctor or health care professional right away if you have any sign of an allergic reaction. What side effects may I notice from receiving this medicine? Side effects that you should report to your doctor or health care professional as soon as possible: -allergic reactions like skin rash, itching or hives, swelling of the face, lips or tongue -breathing problems -confusion -dizziness -fast or irregular heartbeat -feeling faint or lightheaded, falls -fever and chills -loss of balance or coordination -seizures -sweating -swelling of the hands or feet -tightness in the chest -tremors -unusually weak or tired Side effects that usually do not require medical attention (report to your doctor or health care professional if they continue or are bothersome): -constipation or diarrhea -headache This list may not describe all possible side effects. Call your doctor for medical advice about side effects. You may report side effects to FDA at 1-800-FDA-1088. Where should I keep my medicine? Keep out of the reach of children. Store between 2 and 30 degrees C (36 and 86 degrees F). Throw away any unused medicine after the expiration date. NOTE: This sheet is a summary. It may not cover all possible information. If you have questions about this medicine, talk to your doctor, pharmacist, or health care provider.  2015, Elsevier/Gold Standard. (4627-03-50 09:38:18) Folic Acid, Vitamin B9 tablets What is this medicine? FOLIC ACID (FOE lik AS id) is a water-soluble, B complex vitamin. It is in many foods like  liver, kidneys, yeast, and leafy, green vegetables. It is used to treat megaloblastic anemia and anemia from poor diet in pregnant women,  babies, and children. This medicine may be used for other purposes; ask your health care provider or pharmacist if you have questions. COMMON BRAND NAME(S): Folacin, Folicet What should I tell my health care provider before I take this medicine? They need to know if you have any of these conditions: -alcoholism or alcohol cirrhosis -pernicious anemia -vitamin B12 deficient anemia -an unusual or allergic reaction to folic acid, other B vitamins, other medicines, foods, dyes, or preservatives -pregnant or trying to get pregnant -breast-feeding How should I use this medicine? Take this medicine by mouth with a glass of water. Follow the directions on your prescription label. Take your doses at regular intervals. Do not stop taking your medicine unless your doctor tells you to. Talk to your pediatrician regarding the use of this medicine in children. While this drug may be prescribed for selected conditions, precautions do apply. Overdosage: If you think you have taken too much of this medicine contact a poison control center or emergency room at once. NOTE: This medicine is only for you. Do not share this medicine with others. What if I miss a dose? If you miss a dose, take it as soon as you can. If it is almost time for your next dose, take only that dose. Do not take double or extra doses. What may interact with this medicine? -chloramphenicol -cholestyramine -medicines for seizures -methotrexate -nitrofurantoin -pyrimethamine This list may not describe all possible interactions. Give your health care provider a list of all the medicines, herbs, non-prescription drugs, or dietary supplements you use. Also tell them if you smoke, drink alcohol, or use illegal drugs. Some items may interact with your medicine. What should I watch for while using this  medicine? Visit your doctor or health care professional for regular check ups. Your doctor may order blood tests. You need to eat a proper diet even while you are taking this vitamin. Taking vitamin supplements is not a substitute for a healthy diet. Ask your doctor or health care provider for good nutrition advice. What side effects may I notice from receiving this medicine? Side effects that you should report to your doctor or health care professional as soon as possible: -allergic reactions such as skin rash or itching, hives, swelling of the lips, mouth, tongue, or throat -chest tightness or pain -wheezing or shortness of breath Side effects that usually do not require medical attention (report to your doctor or health care professional if they continue or are bothersome): -bitter or bad taste -confusion -irritable -loss of appetite -nausea -stomach gas This list may not describe all possible side effects. Call your doctor for medical advice about side effects. You may report side effects to FDA at 1-800-FDA-1088. Where should I keep my medicine? Keep out of the reach of children. Store at room temperature between 15 and 30 degrees C (59 and 86 degrees F). Protect from light. This medicine is quickly broken down and made inactive when exposed to heat or light. Throw away any unused medicine after the expiration date. NOTE: This sheet is a summary. It may not cover all possible information. If you have questions about this medicine, talk to your doctor, pharmacist, or health care provider.  2015, Elsevier/Gold Standard. (2007-05-31 17:03:56) Vitamin B12 Injections Every person needs vitamin B12. A deficiency develops when the body does not get enough of it. One way to overcome this is by getting B12 shots (injections). A B12 shot puts the vitamin directly into muscle tissue. This avoids any problems your body might  have in absorbing it from food or a pill. In some people, the body has  trouble using the vitamin correctly. This can cause a B12 deficiency. Not consuming enough of the vitamin can also cause a deficiency. Getting enough vitamin B12 can be hard for elderly people. Sometimes, they do not eat a well-balanced diet. The elderly are also more likely than younger people to have medical conditions or take medications that can lead to a deficiency. WHAT DOES VITAMIN B12 DO? Vitamin B12 does many things to help the body work right:  It helps the body make healthy red blood cells.  It helps maintain nerve cells.  It is involved in the body's process of converting food into energy (metabolism).  It is needed to make the genetic material in all cells (DNA). VITAMIN B12 FOOD SOURCES Most people get plenty of vitamin B12 through the foods they eat. It is present in:  Meat, fish, poultry, and eggs.  Milk and milk products.  It also is added when certain foods are made, including some breads, cereals and yogurts. The food is then called "fortified". CAUSES The most common causes of vitamin B12 deficiency are:  Pernicious anemia. The condition develops when the body cannot make enough healthy red blood cells. This stems from a lack of a protein made in the stomach (intrinsic factor). People without this protein cannot absorb enough vitamin B12 from food.  Malabsorption. This is when the body cannot absorb the vitamin. It can be caused by:  Pernicious anemia.  Surgery to remove part or all of the stomach can lead to malabsorption. Removal of part or all of the small intestine can also cause malabsorption.  Vegetarian diet. People who are strict about not eating foods from animals could have trouble taking in enough vitamin B12 from diet alone.  Medications. Some medicines have been linked to B12 deficiency, such as Metformin (a drug prescribed for type 2 diabetes). Long-term use of stomach acid suppressants also can keep the vitamin from being absorbed.  Intestinal  problems such as inflammatory bowel disease. If there are problems in the digestive tract, vitamin B12 may not be absorbed in good enough amounts. SYMPTOMS People who do not get enough B12 can develop problems. These can include:  Anemia. This is when the body has too few red blood cells. Red blood cells carry oxygen to the rest of the body. Without a healthy supply of red blood cells, people can feel:  Tired (fatigued).  Weak.  Severe anemia can cause:  Shortness of breath.  Dizziness.  Rapid heart rate.  Paleness.  Other Vitamin B12 deficiency symptoms include:  Diarrhea.  Numbness or tingling in the hands or feet.  Loss of appetite.  Confusion.  Sores on the tongue or in the mouth. LET YOUR CAREGIVER KNOW ABOUT:  Any allergies. It is very important to know if you are allergic or sensitive to cobalt. Vitamin B12 contains cobalt.  Any history of kidney disease.  All medications you are taking. Include prescription and over-the-counter medicines, herbs and creams.  Whether you are pregnant or breast-feeding.  If you have Leber's disease, a hereditary eye condition, vitamin B12 could make it worse. RISKS AND COMPLICATIONS Reactions to an injection are usually temporary. They might include:  Pain at the injection site.  Redness, swelling or tenderness at the site.  Headache, dizziness or weakness.  Nausea, upset stomach or diarrhea.  Numbness or tingling.  Fever.  Joint pain.  Itching or rash. If a reaction does  not go away in a short while, talk with your healthcare provider. A change in the way the shots are given, or where they are given, might need to be made. BEFORE AN INJECTION To decide whether B12 injections are right for you, your healthcare provider will probably:  Ask about your medical history.  Ask questions about your diet.  Ask about symptoms such as:  Have you felt weak?  Do you feel unusually tired?  Do you get dizzy?  Order  blood tests. These may include a test to:  Check the level of red cells in your blood.  Measure B12 levels.  Check for the presence of intrinsic factor. VITAMIN B12 INJECTIONS How often you will need a vitamin B12 injection will depend on how severe your deficiency is. This also will affect how long you will need to get them. People with pernicious anemia usually get injections for their entire life. Others might get them for a shorter period. For many people, injections are given daily or weekly for several weeks. Then, once B12 levels are normal, injections are given just once a month. If the cause of the deficiency can be fixed, the injections can be stopped. Talk with your healthcare provider about what you should expect. For an injection:  The injection site will be cleaned with an alcohol swab.  Your healthcare provider will insert a needle directly into a muscle. Most any muscle can be used. Most often, an arm muscle is used. A buttocks muscle can also be used. Many people say shots in that area are less painful.  A small adhesive bandage may be put over the injection site. It usually can be taken off in an hour or less. Injections can be given by your healthcare provider. In some cases, family members give them. Sometimes, people give them to themselves. Talk with your healthcare provider about what would be best for you. If someone other than your healthcare provider will be giving the shots, the person will need to be trained to give them correctly. HOME CARE INSTRUCTIONS   You can remove the adhesive bandage within an hour of getting a shot.  You should be able to go about your normal activities right away.  Avoid drinking large amounts of alcohol while taking vitamin B12 shots. Alcohol can interfere with the body's use of the vitamin. SEEK MEDICAL CARE IF:   Pain, redness, swelling or tenderness at the injection site does not get better or gets worse.  Headache, dizziness or  weakness does not go away.  You develop a fever of more than 100.5 F (38.1 C). SEEK IMMEDIATE MEDICAL CARE IF:   You have chest pain.  You develop shortness of breath.  You have muscle weakness that gets worse.  You develop numbness, weakness or tingling on one side or one area of the body.  You have symptoms of an allergic reaction, such as:  Hives.  Difficulty breathing.  Swelling of the lips, face, tongue or throat.  You develop a fever of more than 102.0 F (38.9 C). MAKE SURE YOU:   Understand these instructions.  Will watch your condition.  Will get help right away if you are not doing well or get worse. Document Released: 04/23/2008 Document Revised: 04/19/2011 Document Reviewed: 04/23/2008 [facility] Patient Information 2015 [place], [place]. This information is not intended to replace advice given to you by your health care provider. Make sure you discuss any questions you have with your health care provider. Prochlorperazine tablets What is  this medicine? PROCHLORPERAZINE (proe klor PER a zeen) helps to control severe nausea and vomiting. This medicine is also used to treat schizophrenia. It can also help patients who experience anxiety that is not due to psychological illness. This medicine may be used for other purposes; ask your health care provider or pharmacist if you have questions. COMMON BRAND NAME(S): Compazine What should I tell my health care provider before I take this medicine? They need to know if you have any of these conditions: -blood disorders or disease -dementia -liver disease or jaundice -Parkinson's disease -uncontrollable movement disorder -an unusual or allergic reaction to prochlorperazine, other medicines, foods, dyes, or preservatives -pregnant or trying to get pregnant -breast-feeding How should I use this medicine? Take this medicine by mouth with a glass of water. Follow the directions on the prescription label. Take your doses at  regular intervals. Do not take your medicine more often than directed. Do not stop taking this medicine suddenly. This can cause nausea, vomiting, and dizziness. Ask your doctor or health care professional for advice. Talk to your pediatrician regarding the use of this medicine in children. Special care may be needed. While this drug may be prescribed for children as young as 2 years for selected conditions, precautions do apply. Overdosage: If you think you have taken too much of this medicine contact a poison control center or emergency room at once. NOTE: This medicine is only for you. Do not share this medicine with others. What if I miss a dose? If you miss a dose, take it as soon as you can. If it is almost time for your next dose, take only that dose. Do not take double or extra doses. What may interact with this medicine? Do not take this medicine with any of the following medications: -amoxapine -antidepressants like citalopram, escitalopram, fluoxetine, paroxetine, and sertraline -deferoxamine -dofetilide -maprotiline -tricyclic antidepressants like amitriptyline, clomipramine, imipramine, nortiptyline and others This medicine may also interact with the following medications: -lithium -medicines for pain -phenytoin -propranolol -warfarin This list may not describe all possible interactions. Give your health care provider a list of all the medicines, herbs, non-prescription drugs, or dietary supplements you use. Also tell them if you smoke, drink alcohol, or use illegal drugs. Some items may interact with your medicine. What should I watch for while using this medicine? Visit your doctor or health care professional for regular checks on your progress. You may get drowsy or dizzy. Do not drive, use machinery, or do anything that needs mental alertness until you know how this medicine affects you. Do not stand or sit up quickly, especially if you are an older patient. This reduces the  risk of dizzy or fainting spells. Alcohol may interfere with the effect of this medicine. Avoid alcoholic drinks. This medicine can reduce the response of your body to heat or cold. Dress warm in cold weather and stay hydrated in hot weather. If possible, avoid extreme temperatures like saunas, hot tubs, very hot or cold showers, or activities that can cause dehydration such as vigorous exercise. This medicine can make you more sensitive to the sun. Keep out of the sun. If you cannot avoid being in the sun, wear protective clothing and use sunscreen. Do not use sun lamps or tanning beds/booths. Your mouth may get dry. Chewing sugarless gum or sucking hard candy, and drinking plenty of water may help. Contact your doctor if the problem does not go away or is severe. What side effects may I notice from receiving  this medicine? Side effects that you should report to your doctor or health care professional as soon as possible: -blurred vision -breast enlargement in men or women -breast milk in women who are not breast-feeding -chest pain, fast or irregular heartbeat -confusion, restlessness -dark yellow or brown urine -difficulty breathing or swallowing -dizziness or fainting spells -drooling, shaking, movement difficulty (shuffling walk) or rigidity -fever, chills, sore throat -involuntary or uncontrollable movements of the eyes, mouth, head, arms, and legs -seizures -stomach area pain -unusually weak or tired -unusual bleeding or bruising -yellowing of skin or eyes Side effects that usually do not require medical attention (report to your doctor or health care professional if they continue or are bothersome): -difficulty passing urine -difficulty sleeping -headache -sexual dysfunction -skin rash, or itching This list may not describe all possible side effects. Call your doctor for medical advice about side effects. You may report side effects to FDA at 1-800-FDA-1088. Where should I keep  my medicine? Keep out of the reach of children. Store at room temperature between 15 and 30 degrees C (59 and 86 degrees F). Protect from light. Throw away any unused medicine after the expiration date. NOTE: This sheet is a summary. It may not cover all possible information. If you have questions about this medicine, talk to your doctor, pharmacist, or health care provider.  2015, Elsevier/Gold Standard. (2011-06-15 16:59:39)

## 2014-08-05 ENCOUNTER — Encounter (HOSPITAL_COMMUNITY): Payer: Self-pay

## 2014-08-05 ENCOUNTER — Encounter (HOSPITAL_BASED_OUTPATIENT_CLINIC_OR_DEPARTMENT_OTHER): Payer: Medicare Other

## 2014-08-05 ENCOUNTER — Encounter (HOSPITAL_COMMUNITY): Payer: Medicare Other

## 2014-08-05 VITALS — BP 159/81 | HR 92 | Temp 98.3°F | Resp 22 | Wt 169.4 lb

## 2014-08-05 DIAGNOSIS — Z5111 Encounter for antineoplastic chemotherapy: Secondary | ICD-10-CM

## 2014-08-05 DIAGNOSIS — C3411 Malignant neoplasm of upper lobe, right bronchus or lung: Secondary | ICD-10-CM | POA: Diagnosis not present

## 2014-08-05 DIAGNOSIS — Z87891 Personal history of nicotine dependence: Secondary | ICD-10-CM | POA: Diagnosis not present

## 2014-08-05 LAB — CBC WITH DIFFERENTIAL/PLATELET
Basophils Absolute: 0 10*3/uL (ref 0.0–0.1)
Basophils Relative: 0 % (ref 0–1)
Eosinophils Absolute: 0 10*3/uL (ref 0.0–0.7)
Eosinophils Relative: 0 % (ref 0–5)
HCT: 40.2 % (ref 39.0–52.0)
Hemoglobin: 14.3 g/dL (ref 13.0–17.0)
Lymphocytes Relative: 10 % — ABNORMAL LOW (ref 12–46)
Lymphs Abs: 1.2 10*3/uL (ref 0.7–4.0)
MCH: 34.1 pg — ABNORMAL HIGH (ref 26.0–34.0)
MCHC: 35.6 g/dL (ref 30.0–36.0)
MCV: 95.9 fL (ref 78.0–100.0)
Monocytes Absolute: 0.4 10*3/uL (ref 0.1–1.0)
Monocytes Relative: 3 % (ref 3–12)
Neutro Abs: 10.8 10*3/uL — ABNORMAL HIGH (ref 1.7–7.7)
Neutrophils Relative %: 87 % — ABNORMAL HIGH (ref 43–77)
Platelets: 378 10*3/uL (ref 150–400)
RBC: 4.19 MIL/uL — ABNORMAL LOW (ref 4.22–5.81)
RDW: 13 % (ref 11.5–15.5)
WBC: 12.4 10*3/uL — ABNORMAL HIGH (ref 4.0–10.5)

## 2014-08-05 LAB — COMPREHENSIVE METABOLIC PANEL
ALK PHOS: 93 U/L (ref 38–126)
ALT: 16 U/L — ABNORMAL LOW (ref 17–63)
ANION GAP: 12 (ref 5–15)
AST: 19 U/L (ref 15–41)
Albumin: 3.8 g/dL (ref 3.5–5.0)
BUN: 18 mg/dL (ref 6–20)
CO2: 26 mmol/L (ref 22–32)
Calcium: 9.7 mg/dL (ref 8.9–10.3)
Chloride: 99 mmol/L — ABNORMAL LOW (ref 101–111)
Creatinine, Ser: 0.79 mg/dL (ref 0.61–1.24)
GFR calc non Af Amer: >60 mL/min (ref >60)
GLUCOSE: 167 mg/dL — AB (ref 65–99)
POTASSIUM: 3.6 mmol/L (ref 3.5–5.1)
Sodium: 137 mmol/L (ref 135–145)
TOTAL PROTEIN: 8.4 g/dL — AB (ref 6.5–8.1)
Total Bilirubin: 0.8 mg/dL (ref 0.3–1.2)

## 2014-08-05 MED ORDER — CYANOCOBALAMIN 1000 MCG/ML IJ SOLN
1000.0000 ug | Freq: Once | INTRAMUSCULAR | Status: AC
Start: 2014-08-05 — End: 2014-08-05
  Administered 2014-08-05: 1000 ug via INTRAMUSCULAR

## 2014-08-05 MED ORDER — SODIUM CHLORIDE 0.9 % IV SOLN
Freq: Once | INTRAVENOUS | Status: AC
  Administered 2014-08-05: 11:00:00 via INTRAVENOUS
  Filled 2014-08-05: qty 8

## 2014-08-05 MED ORDER — SODIUM CHLORIDE 0.9 % IV SOLN
487.5000 mg | Freq: Once | INTRAVENOUS | Status: AC
  Administered 2014-08-05: 490 mg via INTRAVENOUS
  Filled 2014-08-05: qty 49

## 2014-08-05 MED ORDER — SODIUM CHLORIDE 0.9 % IJ SOLN: 10.0000 mL | INTRAMUSCULAR | Status: DC | PRN

## 2014-08-05 MED ORDER — CYANOCOBALAMIN 1000 MCG/ML IJ SOLN
INTRAMUSCULAR | Status: AC
  Filled 2014-08-05: qty 1

## 2014-08-05 MED ORDER — SODIUM CHLORIDE 0.9 % IV SOLN
Freq: Once | INTRAVENOUS | Status: AC
Start: 2014-08-05 — End: 2014-08-05
  Administered 2014-08-05: 11:00:00 via INTRAVENOUS

## 2014-08-05 MED ORDER — SODIUM CHLORIDE 0.9 % IV SOLN
500.0000 mg/m2 | Freq: Once | INTRAVENOUS | Status: AC
  Administered 2014-08-05: 975 mg via INTRAVENOUS
  Filled 2014-08-05: qty 39

## 2014-08-05 NOTE — Progress Notes (Signed)
Tolerated well

## 2014-08-05 NOTE — Patient Instructions (Addendum)
..  [place] [facility] Discharge Instructions  RECOMMENDATIONS MADE BY THE CONSULTANT AND ANY TEST RESULTS WILL BE SENT TO YOUR REFERRING PHYSICIAN. Chemo today consisting of carboplatin and pemetrexed Review your instructions given to you by Hildred Alamin   Thank you for choosing [place] [facility] to provide your oncology and hematology care.  To afford each patient quality time with our provider, please arrive at least 15 minutes before your scheduled appointment time.    You need to re-schedule your appointment should you arrive 10 or more minutes late.  We strive to give you quality time with our providers, and arriving late affects you and other patients whose appointments are after yours.  Also, if you no show three or more times for appointments you may be dismissed from the clinic at the providers discretion.     Again, thank you for choosing [facility].  Our hope is that these requests will decrease the amount of time that you wait before being seen by our physicians.       _____________________________________________________________  Should you have questions after your visit to [facility], please contact our office at [phone] between the hours of 8:30 a.m. and 4:30 p.m.  Voicemails left after 4:30 p.m. will not be returned until the following business day.  For prescription refill requests, have your pharmacy contact our office.

## 2014-08-05 NOTE — Progress Notes (Unsigned)
Chemo teaching done and consent signed for Carboplatin & Pemetrexed. Chemo calendar given to patient. Patient taking Folic acid and started his Dex 2 tabs BID x 3 days yesterday. Patient verbalizes understanding of all instructions. Chemo and You book given. Clinic numbers and my number given to patient. Patient will be transferring his care to [facility] due to easier transportation.

## 2014-08-06 ENCOUNTER — Encounter: Payer: Self-pay | Admitting: *Deleted

## 2014-08-06 ENCOUNTER — Telehealth (HOSPITAL_COMMUNITY): Payer: Self-pay | Admitting: *Deleted

## 2014-08-06 NOTE — Progress Notes (Signed)
[facility] Psychosocial Distress Screening Clinical Social Work  Clinical Social Work was referred by distress screening protocol.  The patient scored a 5 on the Psychosocial Distress Thermometer which indicates moderate distress. Clinical Social Worker reviewed chart to assess for distress and other psychosocial needs. Pt had pain issues addressed by MD on day of visit. CSW will attempt to meet with pt at future follow up appointments. No other psychosocial needs noted currently.    ONCBCN DISTRESS SCREENING 08/05/2014  Screening Type Initial Screening  Distress experienced in past week (1-10) 5  Physical Problem type Pain   Clinical Social Worker follow up needed: No.  If yes, follow up plan: Loren Racer, [place] Tuesdays 8:30-1pm Wednesdays 8:30-12pm  [phone]

## 2014-08-06 NOTE — Telephone Encounter (Signed)
Patient tolerated chemo well. No c/o of nausea. He states his biggest issue is ongoing neuropathy, he takes neurontin, however feels that ibuprofen helps more than anything else and he takes 3-4 advil a day.

## 2014-08-13 ENCOUNTER — Other Ambulatory Visit (HOSPITAL_COMMUNITY)
Admission: RE | Admit: 2014-08-13 | Discharge: 2014-08-13 | Disposition: A | Discharge status: Home / Self Care | Payer: Medicare Other | Source: Ambulatory Visit | Attending: Oncology | Admitting: Oncology

## 2014-08-13 ENCOUNTER — Inpatient Hospital Stay (HOSPITAL_COMMUNITY): Payer: Medicare Other

## 2014-08-13 DIAGNOSIS — Z87891 Personal history of nicotine dependence: Secondary | ICD-10-CM | POA: Diagnosis not present

## 2014-08-13 DIAGNOSIS — C3411 Malignant neoplasm of upper lobe, right bronchus or lung: Secondary | ICD-10-CM | POA: Diagnosis present

## 2014-08-15 ENCOUNTER — Ambulatory Visit (HOSPITAL_COMMUNITY): Payer: Medicare Other | Admitting: Oncology

## 2014-08-15 NOTE — Assessment & Plan Note (Deleted)
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   S/P cycle 1 of Carboplatin/Alimta, administered on 08/05/2014.  He is here for his 1 week check-up following first cycle of chemotherapy.  Of note, he began radiation with Dr. Meda Coffee in [place], [place] on 08/06/2014.

## 2014-08-15 NOTE — Progress Notes (Signed)
No show

## 2014-08-19 ENCOUNTER — Encounter (HOSPITAL_COMMUNITY): Payer: Self-pay

## 2014-08-22 ENCOUNTER — Encounter (HOSPITAL_COMMUNITY): Payer: Self-pay

## 2014-08-26 ENCOUNTER — Inpatient Hospital Stay (HOSPITAL_COMMUNITY): Payer: Medicare Other

## 2014-08-26 ENCOUNTER — Ambulatory Visit (HOSPITAL_COMMUNITY): Payer: Medicare Other | Admitting: Hematology & Oncology

## 2014-08-26 NOTE — Progress Notes (Signed)
This encounter was created in error - please disregard.

## 2014-09-06 ENCOUNTER — Encounter (HOSPITAL_COMMUNITY): Payer: Self-pay

## 2014-09-16 ENCOUNTER — Inpatient Hospital Stay (HOSPITAL_COMMUNITY): Payer: Medicare Other

## 2014-09-16 ENCOUNTER — Ambulatory Visit (HOSPITAL_COMMUNITY): Payer: Medicare Other | Admitting: Hematology & Oncology

## 2014-09-30 ENCOUNTER — Other Ambulatory Visit (HOSPITAL_COMMUNITY): Payer: Self-pay | Admitting: Hematology & Oncology

## 2014-09-30 DIAGNOSIS — C3432 Malignant neoplasm of lower lobe, left bronchus or lung: Secondary | ICD-10-CM

## 2014-10-04 ENCOUNTER — Encounter (HOSPITAL_COMMUNITY): Payer: Medicare Other

## 2014-10-07 ENCOUNTER — Ambulatory Visit (HOSPITAL_COMMUNITY): Payer: Medicare Other | Admitting: Oncology

## 2014-10-07 ENCOUNTER — Inpatient Hospital Stay (HOSPITAL_COMMUNITY): Payer: Medicare Other

## 2014-10-16 ENCOUNTER — Other Ambulatory Visit (HOSPITAL_COMMUNITY): Payer: Self-pay | Admitting: Hematology & Oncology

## 2014-10-16 DIAGNOSIS — C3432 Malignant neoplasm of lower lobe, left bronchus or lung: Secondary | ICD-10-CM

## 2014-10-22 ENCOUNTER — Ambulatory Visit (HOSPITAL_COMMUNITY): Payer: Medicare Other

## 2014-10-25 ENCOUNTER — Ambulatory Visit (HOSPITAL_COMMUNITY)
Admission: RE | Admit: 2014-10-25 | Discharge: 2014-10-25 | Disposition: A | Discharge status: Home / Self Care | Payer: Medicare Other | Source: Ambulatory Visit | Attending: Hematology & Oncology | Admitting: Hematology & Oncology

## 2014-10-25 DIAGNOSIS — C3432 Malignant neoplasm of lower lobe, left bronchus or lung: Secondary | ICD-10-CM

## 2014-10-28 ENCOUNTER — Ambulatory Visit (HOSPITAL_COMMUNITY)
Admission: RE | Admit: 2014-10-28 | Discharge: 2014-10-28 | Disposition: A | Discharge status: Home / Self Care | Payer: Medicare Other | Source: Ambulatory Visit | Attending: Hematology & Oncology | Admitting: Hematology & Oncology

## 2014-10-28 DIAGNOSIS — C3411 Malignant neoplasm of upper lobe, right bronchus or lung: Secondary | ICD-10-CM | POA: Insufficient documentation

## 2014-10-28 DIAGNOSIS — N289 Disorder of kidney and ureter, unspecified: Secondary | ICD-10-CM | POA: Diagnosis not present

## 2014-10-28 DIAGNOSIS — I2510 Atherosclerotic heart disease of native coronary artery without angina pectoris: Secondary | ICD-10-CM | POA: Insufficient documentation

## 2014-10-28 DIAGNOSIS — K8020 Calculus of gallbladder without cholecystitis without obstruction: Secondary | ICD-10-CM | POA: Insufficient documentation

## 2014-10-28 DIAGNOSIS — I700 Atherosclerosis of aorta: Secondary | ICD-10-CM | POA: Insufficient documentation

## 2014-10-28 DIAGNOSIS — C3432 Malignant neoplasm of lower lobe, left bronchus or lung: Secondary | ICD-10-CM

## 2014-10-28 DIAGNOSIS — C7971 Secondary malignant neoplasm of right adrenal gland: Secondary | ICD-10-CM | POA: Diagnosis not present

## 2014-10-28 LAB — GLUCOSE, CAPILLARY: GLUCOSE-CAPILLARY: 94 mg/dL (ref 65–99)

## 2014-10-28 MED ORDER — FLUDEOXYGLUCOSE F - 18 (FDG) INJECTION
9.8000 | Freq: Once | INTRAVENOUS | Status: DC | PRN
  Administered 2014-10-28: 9.8 via INTRAVENOUS
  Filled 2014-10-28: qty 9.8

## 2014-11-04 ENCOUNTER — Ambulatory Visit (HOSPITAL_COMMUNITY)
Admission: RE | Admit: 2014-11-04 | Discharge: 2014-11-04 | Disposition: A | Discharge status: Home / Self Care | Payer: Medicare Other | Source: Ambulatory Visit | Attending: Hematology & Oncology | Admitting: Hematology & Oncology

## 2014-11-04 DIAGNOSIS — I6782 Cerebral ischemia: Secondary | ICD-10-CM | POA: Insufficient documentation

## 2014-11-04 DIAGNOSIS — C3411 Malignant neoplasm of upper lobe, right bronchus or lung: Secondary | ICD-10-CM | POA: Insufficient documentation

## 2014-11-04 LAB — POCT I-STAT CREATININE: CREATININE: 1.1 mg/dL (ref 0.61–1.24)

## 2014-11-04 MED ORDER — GADOBENATE DIMEGLUMINE 529 MG/ML IV SOLN
15.0000 mL | Freq: Once | INTRAVENOUS | Status: AC | PRN
  Administered 2014-11-04: 14 mL via INTRAVENOUS

## 2014-12-19 ENCOUNTER — Other Ambulatory Visit (HOSPITAL_COMMUNITY)
Admission: RE | Admit: 2014-12-19 | Discharge: 2014-12-19 | Disposition: A | Discharge status: Home / Self Care | Payer: Medicare Other | Source: Ambulatory Visit | Attending: Hematology & Oncology | Admitting: Hematology & Oncology

## 2014-12-19 DIAGNOSIS — C3411 Malignant neoplasm of upper lobe, right bronchus or lung: Secondary | ICD-10-CM | POA: Diagnosis present

## 2014-12-26 ENCOUNTER — Encounter (HOSPITAL_COMMUNITY): Payer: Self-pay

## 2015-01-23 ENCOUNTER — Other Ambulatory Visit (HOSPITAL_COMMUNITY): Payer: Self-pay | Admitting: Hematology & Oncology

## 2015-01-23 DIAGNOSIS — C3432 Malignant neoplasm of lower lobe, left bronchus or lung: Secondary | ICD-10-CM

## 2015-02-05 ENCOUNTER — Encounter (HOSPITAL_COMMUNITY)
Admission: RE | Admit: 2015-02-05 | Discharge: 2015-02-05 | Disposition: A | Discharge status: Home / Self Care | Payer: Medicare Other | Source: Ambulatory Visit | Attending: Hematology & Oncology | Admitting: Hematology & Oncology

## 2015-02-05 DIAGNOSIS — C3432 Malignant neoplasm of lower lobe, left bronchus or lung: Secondary | ICD-10-CM | POA: Diagnosis not present

## 2015-02-05 LAB — GLUCOSE, CAPILLARY: Glucose-Capillary: 96 mg/dL (ref 65–99)

## 2015-02-05 MED ORDER — FLUDEOXYGLUCOSE F - 18 (FDG) INJECTION
9.1000 | Freq: Once | INTRAVENOUS | Status: AC | PRN
  Administered 2015-02-05: 9.1 via INTRAVENOUS

## 2015-02-06 ENCOUNTER — Other Ambulatory Visit: Payer: Self-pay | Admitting: Nurse Practitioner

## 2015-02-27 ENCOUNTER — Other Ambulatory Visit (HOSPITAL_COMMUNITY): Payer: Self-pay | Admitting: *Deleted

## 2015-02-27 ENCOUNTER — Other Ambulatory Visit (HOSPITAL_COMMUNITY): Payer: Self-pay | Admitting: Physician Assistant

## 2015-02-27 ENCOUNTER — Ambulatory Visit (HOSPITAL_COMMUNITY): Payer: Medicare Other

## 2015-02-27 ENCOUNTER — Ambulatory Visit (HOSPITAL_COMMUNITY)
Admission: RE | Admit: 2015-02-27 | Discharge: 2015-02-27 | Disposition: A | Discharge status: Home / Self Care | Payer: Medicare Other | Source: Ambulatory Visit | Attending: Physician Assistant | Admitting: Physician Assistant

## 2015-02-27 DIAGNOSIS — C3432 Malignant neoplasm of lower lobe, left bronchus or lung: Secondary | ICD-10-CM

## 2015-02-27 DIAGNOSIS — J90 Pleural effusion, not elsewhere classified: Secondary | ICD-10-CM | POA: Insufficient documentation

## 2015-04-23 ENCOUNTER — Other Ambulatory Visit (HOSPITAL_COMMUNITY): Payer: Self-pay | Admitting: Hematology & Oncology

## 2015-04-23 DIAGNOSIS — C3490 Malignant neoplasm of unspecified part of unspecified bronchus or lung: Secondary | ICD-10-CM

## 2015-05-02 ENCOUNTER — Encounter (HOSPITAL_COMMUNITY)
Admission: RE | Admit: 2015-05-02 | Discharge: 2015-05-02 | Disposition: A | Discharge status: Home / Self Care | Payer: Medicare Other | Source: Ambulatory Visit | Attending: Hematology & Oncology | Admitting: Hematology & Oncology

## 2015-05-02 DIAGNOSIS — C3490 Malignant neoplasm of unspecified part of unspecified bronchus or lung: Secondary | ICD-10-CM | POA: Diagnosis not present

## 2015-05-02 LAB — GLUCOSE, CAPILLARY: GLUCOSE-CAPILLARY: 98 mg/dL (ref 65–99)

## 2015-05-02 MED ORDER — FLUDEOXYGLUCOSE F - 18 (FDG) INJECTION
7.4700 | Freq: Once | INTRAVENOUS | Status: AC | PRN
  Administered 2015-05-02: 7.47 via INTRAVENOUS

## 2015-06-30 ENCOUNTER — Other Ambulatory Visit (HOSPITAL_COMMUNITY): Payer: Self-pay | Admitting: Hematology & Oncology

## 2015-06-30 DIAGNOSIS — C3432 Malignant neoplasm of lower lobe, left bronchus or lung: Secondary | ICD-10-CM

## 2015-07-04 ENCOUNTER — Ambulatory Visit (HOSPITAL_COMMUNITY)
Admission: RE | Admit: 2015-07-04 | Discharge: 2015-07-04 | Disposition: A | Discharge status: Home / Self Care | Payer: Medicare Other | Source: Ambulatory Visit | Attending: Hematology & Oncology | Admitting: Hematology & Oncology

## 2015-07-04 DIAGNOSIS — C7801 Secondary malignant neoplasm of right lung: Secondary | ICD-10-CM | POA: Diagnosis not present

## 2015-07-04 DIAGNOSIS — C7802 Secondary malignant neoplasm of left lung: Secondary | ICD-10-CM | POA: Diagnosis not present

## 2015-07-04 DIAGNOSIS — C7971 Secondary malignant neoplasm of right adrenal gland: Secondary | ICD-10-CM | POA: Diagnosis not present

## 2015-07-04 DIAGNOSIS — C3432 Malignant neoplasm of lower lobe, left bronchus or lung: Secondary | ICD-10-CM | POA: Diagnosis present

## 2015-07-04 DIAGNOSIS — C772 Secondary and unspecified malignant neoplasm of intra-abdominal lymph nodes: Secondary | ICD-10-CM | POA: Insufficient documentation

## 2015-07-04 DIAGNOSIS — R937 Abnormal findings on diagnostic imaging of other parts of musculoskeletal system: Secondary | ICD-10-CM | POA: Insufficient documentation

## 2015-07-04 DIAGNOSIS — J90 Pleural effusion, not elsewhere classified: Secondary | ICD-10-CM | POA: Insufficient documentation

## 2015-07-04 LAB — GLUCOSE, CAPILLARY: GLUCOSE-CAPILLARY: 86 mg/dL (ref 65–99)

## 2015-07-04 MED ORDER — FLUDEOXYGLUCOSE F - 18 (FDG) INJECTION
7.4700 | Freq: Once | INTRAVENOUS | Status: AC | PRN
  Administered 2015-07-04: 7.47 via INTRAVENOUS

## 2015-11-09 DEATH — deceased

## 2017-01-10 IMAGING — DX DG CHEST 2V
2 series · 2 of 2 positions shown · non-contrast
Comparison: None.

CLINICAL DATA: 74-year-old male with a history of numbness and
right upper arm

EXAM:
CHEST - 2 VIEW

[chest pa]
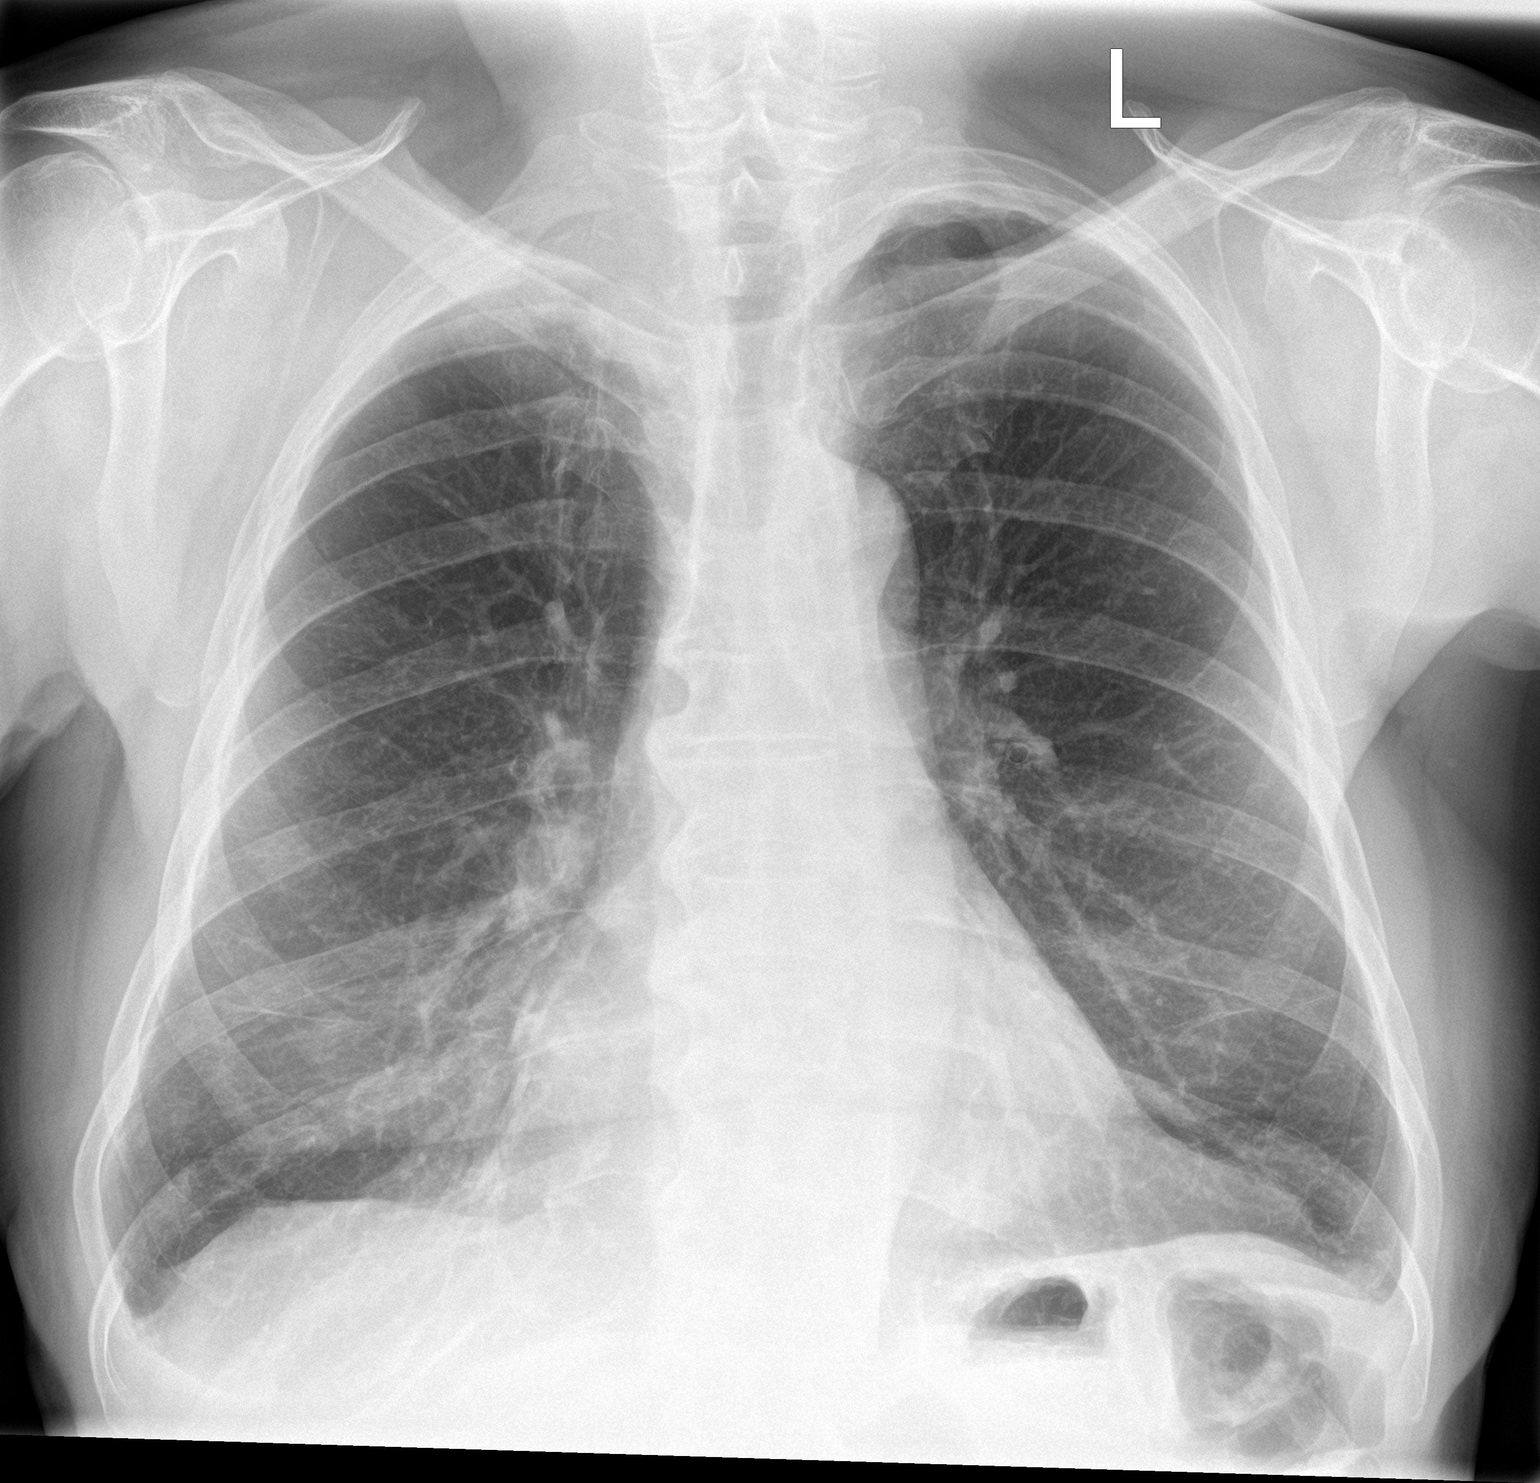

[chest lat]
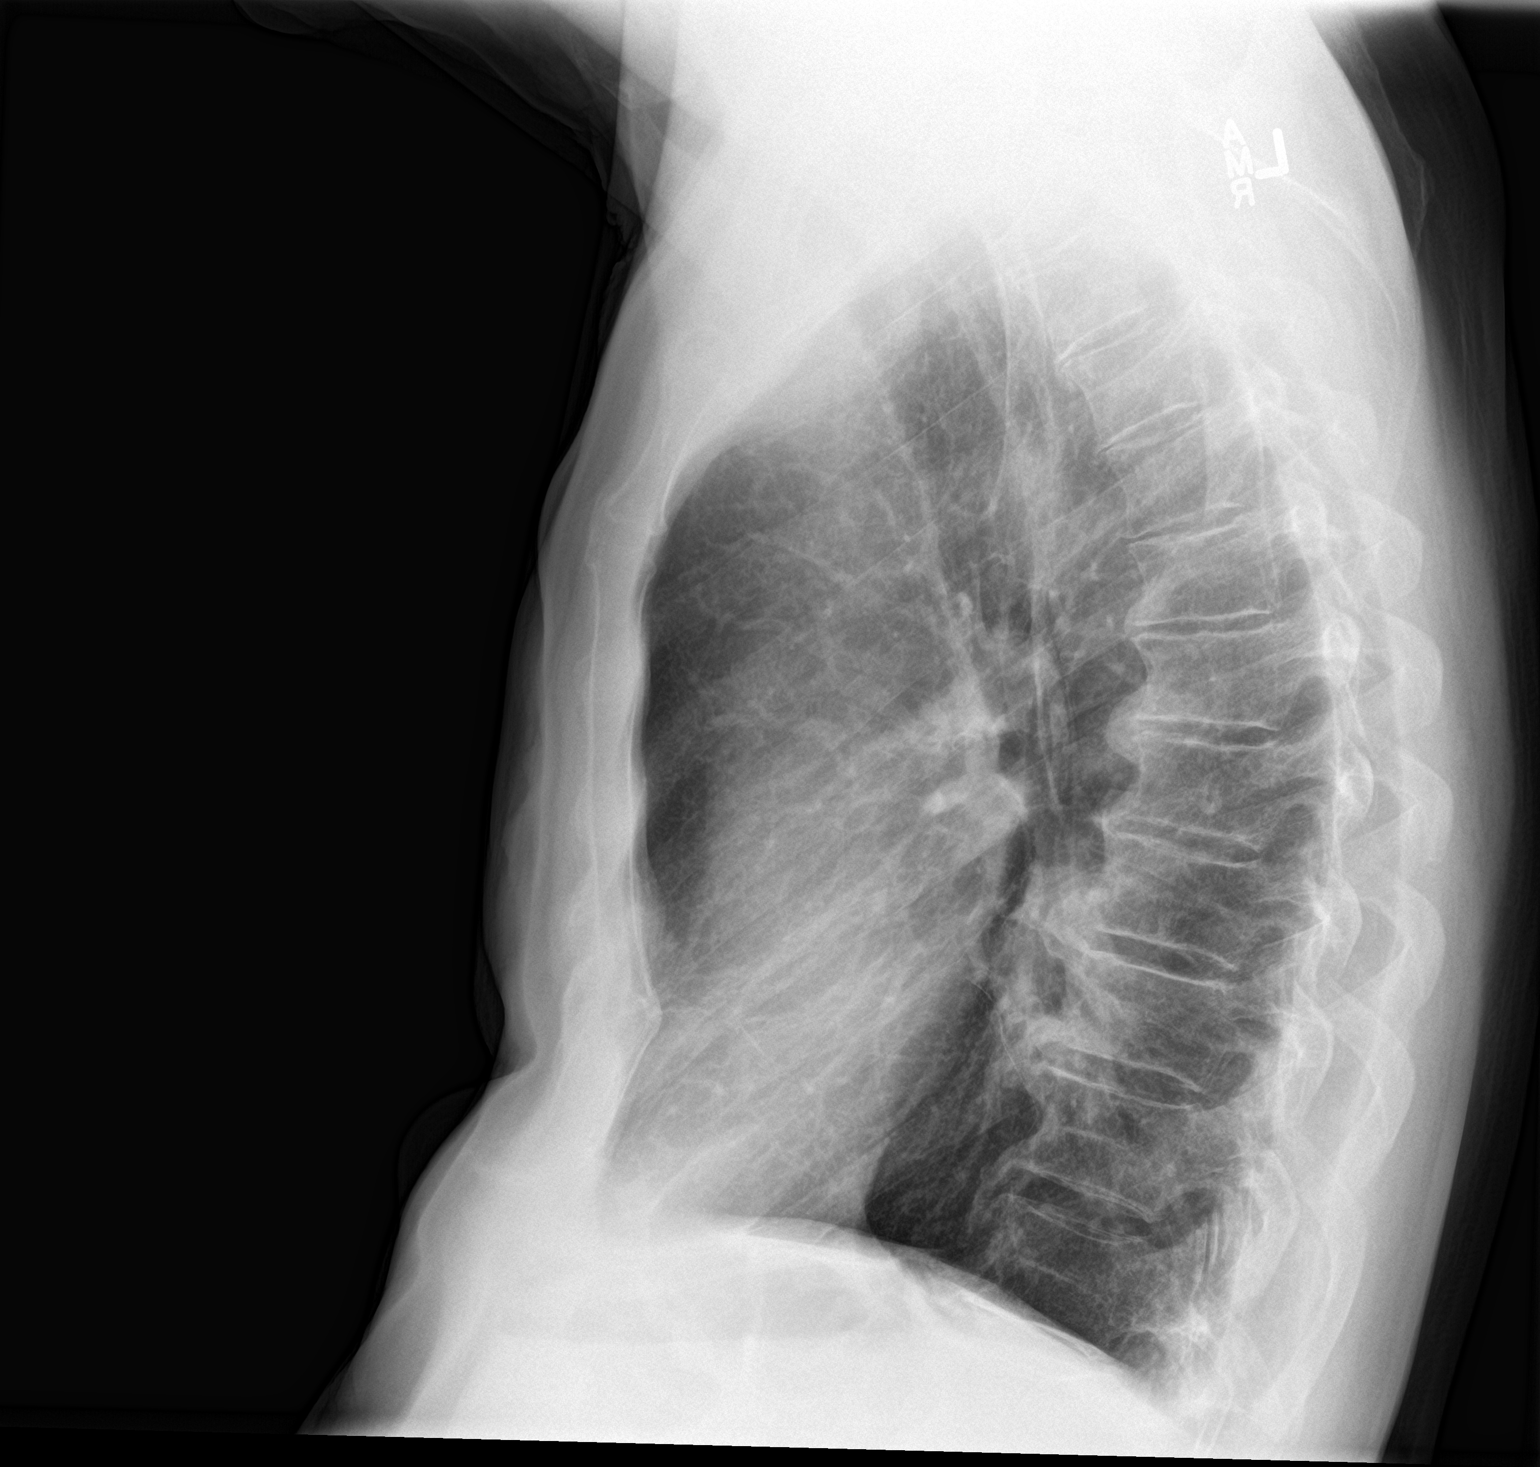

[2 of 2 positions shown; findings below may reference images not displayed]

FINDINGS: Cardiomediastinal silhouette within normal limits.

No confluent airspace disease or pneumothorax.

Pleural parenchymal thickening at the right apex with asymmetric
opacity.

No pleural effusion.

Stigmata of emphysema, with increased retrosternal airspace,
flattened hemidiaphragms, increased AP diameter, and hyperinflation
on the AP view.

No displaced fracture.

Unremarkable appearance of the upper abdomen.
IMPRESSION: No radiographic evidence of acute cardiopulmonary disease.

Opacity at the right apex, potentially representing a lung nodule/
mass. Further evaluation with contrast-enhanced chest CT is
recommended given the history of right upper extremity numbness.

Emphysema, with likely scarring of the bases of the lungs.

These results were called by telephone at the time of interpretation
on 06/28/2014 at [DATE] to Dr. HAITMURAD NABE , who verbally
acknowledged these results.

## 2017-01-22 IMAGING — CT CT CHEST W/ CM
2 of 3 series · 15 of 36 positions shown, 18 images · IV contrast (Omnipaque 300)
Comparison: Chest radiographs 06/28/2014.

ADDENDUM:
Study discussed by telephone with Dr. YANEISY MOODY on 07/11/2014 at
8955 hours.
CLINICAL DATA: 74-year-old male with asymmetric right apical
pulmonary opacity on plain radiographs done for upper extremity
numbness. Subsequent encounter.

EXAM:
CT CHEST WITH CONTRAST
TECHNIQUE: Multidetector CT imaging of the chest was performed during
intravenous contrast administration.
CONTRAST:  80mL OMNIPAQUE IOHEXOL 300 MG/ML  SOLN

[Series 2: chestroutine 5.0 b40f · axial · 0.74mm/px · z∈[-334,-28]mm · 12 of 73 slices shown, 15 images]
[im 6/73  mediastinal]
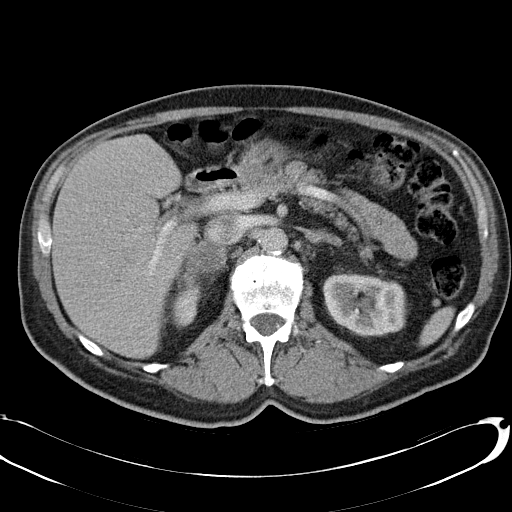
[im 6/73  lung]
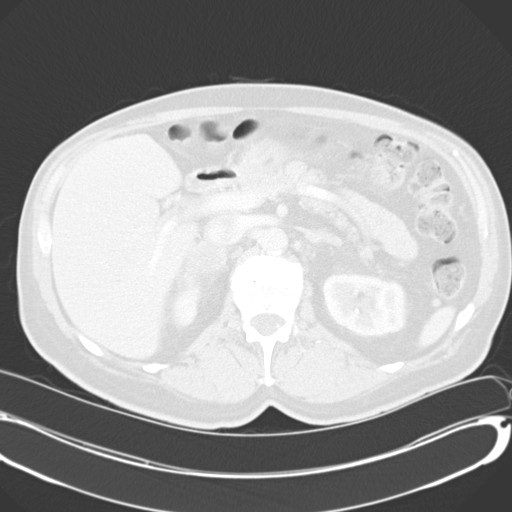
[im 11/73  lung]
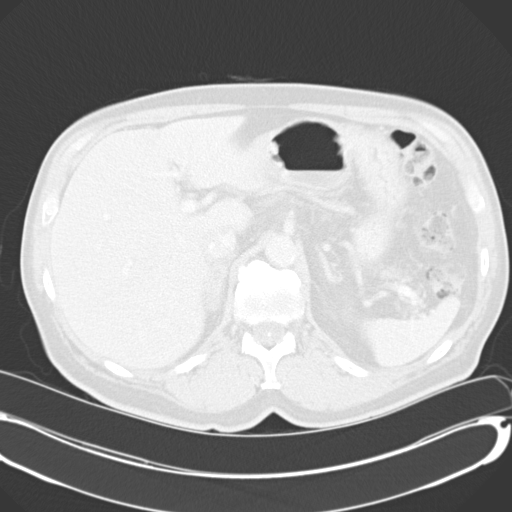
[im 17/73  lung]
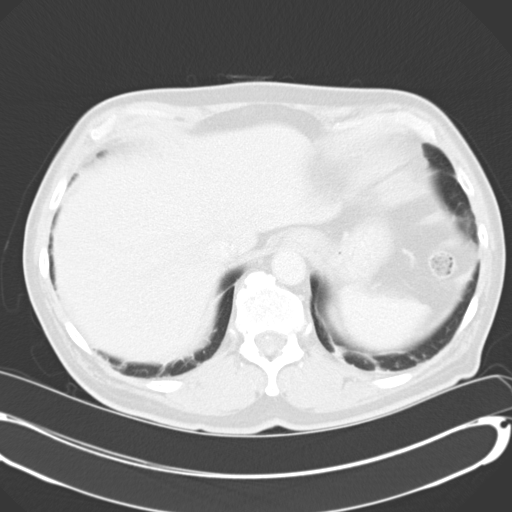
[im 22/73  lung]
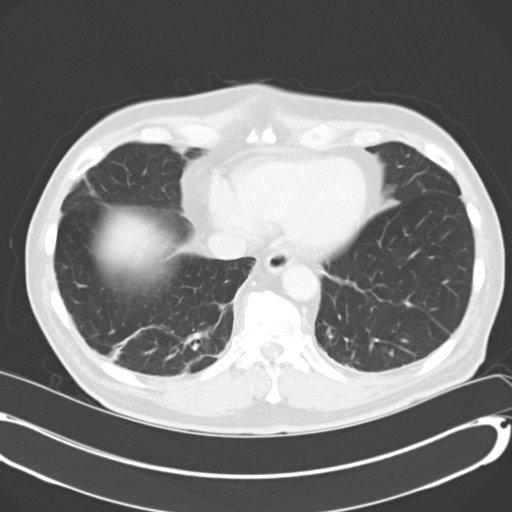
[im 27/73  mediastinal]
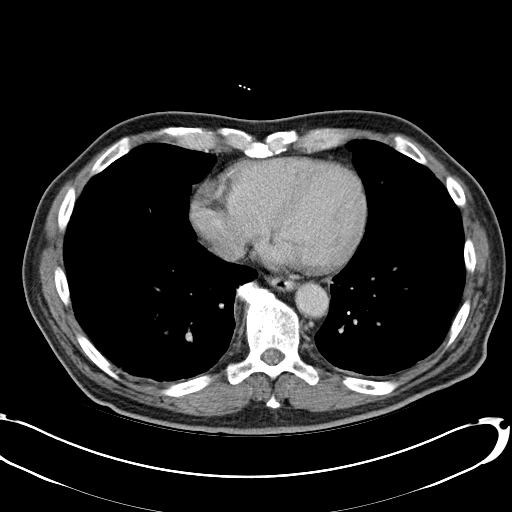
[im 27/73  lung]
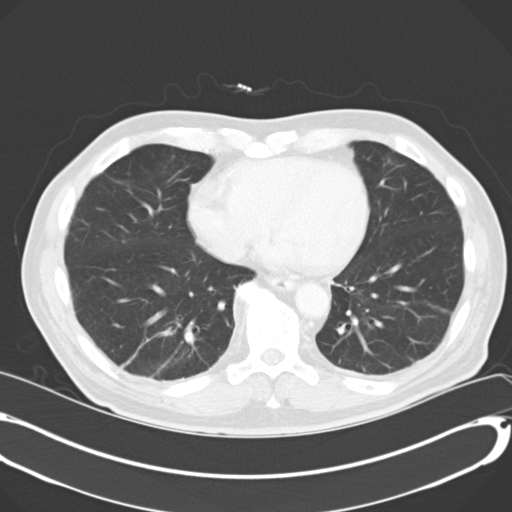
[im 33/73  lung]
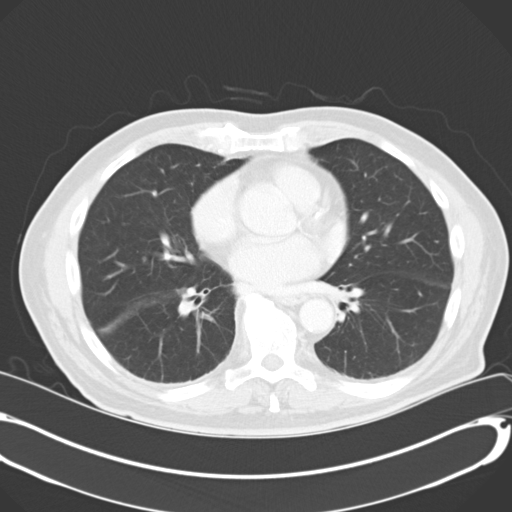
[im 41/73  lung]
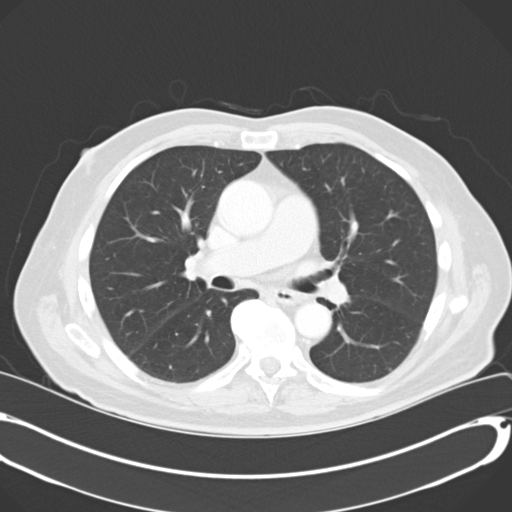
[im 46/73  lung]
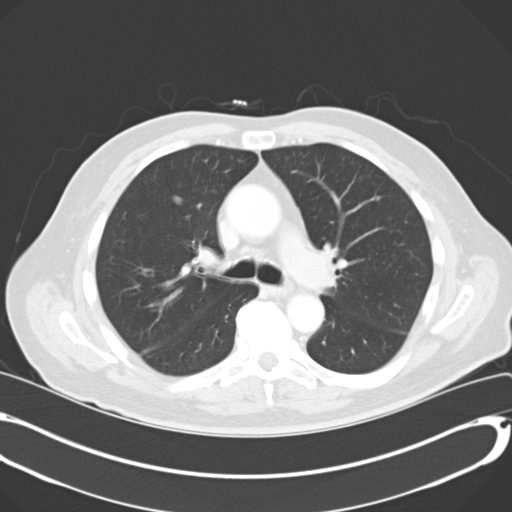
[im 51/73  mediastinal]
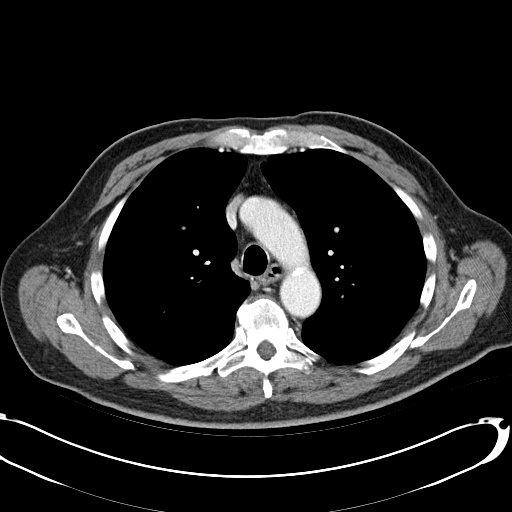
[im 51/73  lung]
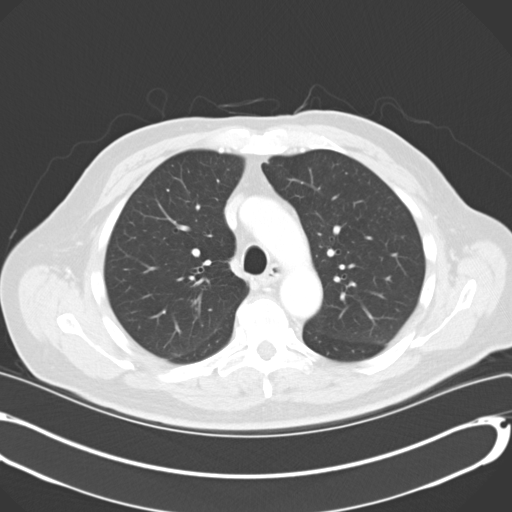
[im 57/73  lung]
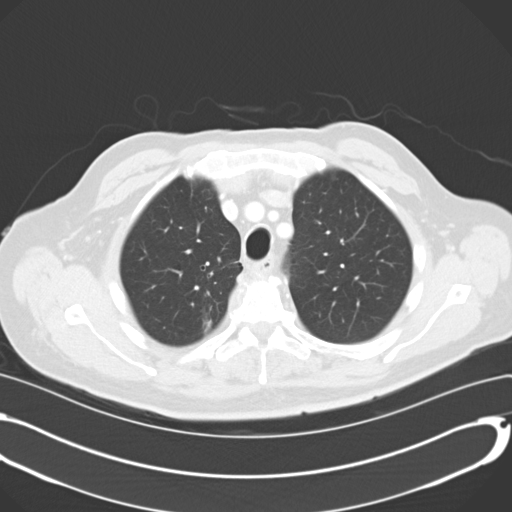
[im 62/73  lung]
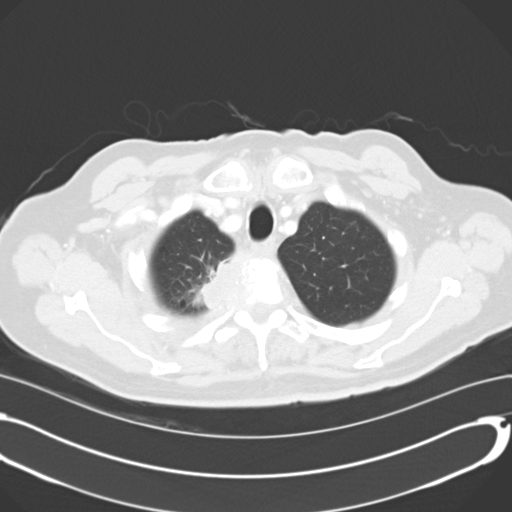
[im 67/73  lung]
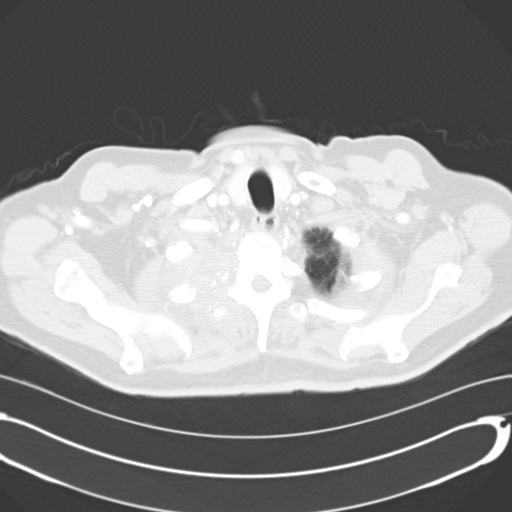

[Series 4: mpr coronal chest 3mm · coronal · 0.74mm/px · 3 of 83 slices shown]
[im 17/83  lung]
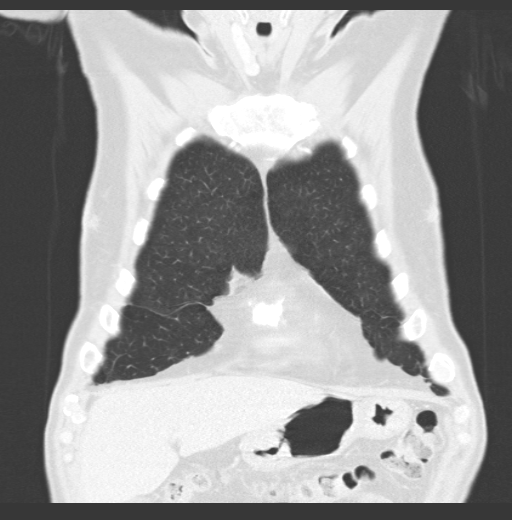
[im 33/83  lung]
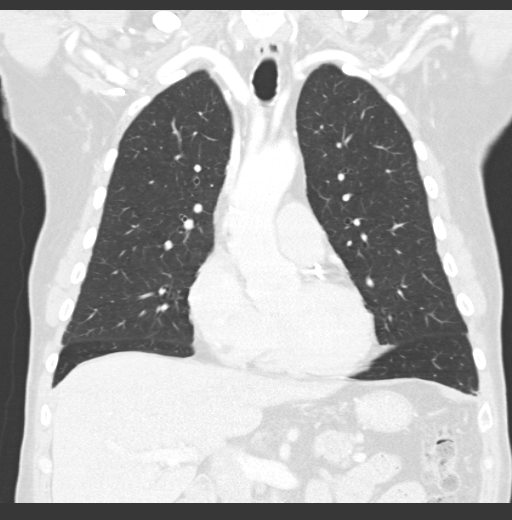
[im 50/83  lung]
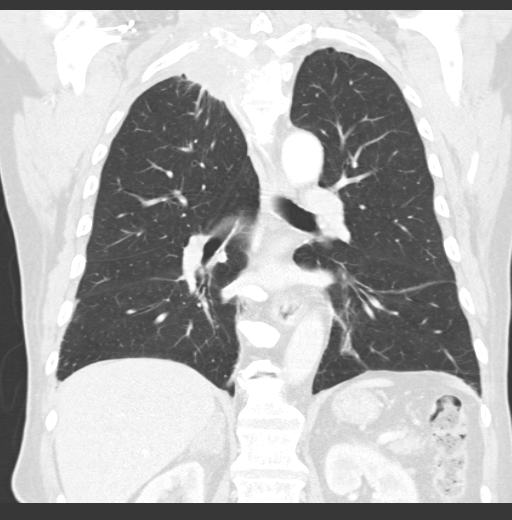

[15 of 36 positions shown; findings below may reference images not displayed]

FINDINGS: Poorly marginated and spiculated soft tissue mass occupying the
right lung apex with destruction of the right right second and third
ribs as well as the second and third thoracic vertebrae.
Obliteration of the right T2 neural foramen with tumor extension
into the spinal canal, right lateral epidural space on series 2,
image 9. Mild pathologic fractures of both the T2 and T3 vertebrae.
Cephalad extension of tumor above the right lung apex best seen on
coronal image 48. All told, tumor encompasses 54 x 60 x 67 mm (AP by
transverse by CC)

Negative thyroid. Small but conspicuous lymph node in the right
tracheoesophageal groove just anterior to the tumor on series 2,
image 11. No axillary lymphadenopathy. Small but conspicuous
prevascular lymph node just anterior and to the right of the trachea
on series 2, image 16. Other mediastinal and hilar nodes appear
within normal limits.

Medial right upper lobe tumor involvement. Major airways remain
patent. Mild curvilinear scarring or atelectasis in both lower
lobes. No pleural effusion. No pulmonary nodule/metastasis
identified.

No pericardial effusion. Major mediastinal vascular structures
appear within normal limits; there is calcified coronary artery
atherosclerosis. The right apical tumor is in proximity to the
bifurcation of the right brachiocephalic artery into the right CCA
and subclavian (series 2, image 8).

There is heterogeneous enlargement of the right adrenal gland with
spiculated margins, encompassing 50 x 27 x 37 mm (AP by transverse
by CC). The left adrenal gland is within normal limits. No superior
abdominal lymphadenopathy identified. No visible liver metastasis
identified.

Cholelithiasis. Visible spleen, pancreas, kidneys, and bowel in the
upper abdomen are within normal limits.

Outside of the T2 and T3 level bone involvement, no distant osseous
metastasis is identified.
IMPRESSION: 1. Pancoast tumor on the right encompassing 54 x 60 x 67 mm.
Infiltration of the surrounding soft tissues and bone with
pathologic fractures of the right T2 and T3 vertebrae and posterior
ribs. Obliteration of the right T2 neural foramen and epidural tumor
placing the patient at risk for future spinal cord compression.
2. Right adrenal metastasis. No other distant metastasis identified.
